# Patient Record
Sex: Female | Born: 1990
Health system: Southern US, Community
[De-identification: ages and names within clinical notes are randomized; demographics above are authoritative.]

## PROBLEM LIST (undated history)

## (undated) DIAGNOSIS — K589 Irritable bowel syndrome without diarrhea: Secondary | ICD-10-CM

## (undated) DIAGNOSIS — F419 Anxiety disorder, unspecified: Secondary | ICD-10-CM

## (undated) DIAGNOSIS — F32A Depression, unspecified: Secondary | ICD-10-CM

## (undated) DIAGNOSIS — F1910 Other psychoactive substance abuse, uncomplicated: Secondary | ICD-10-CM

## (undated) DIAGNOSIS — T7840XA Allergy, unspecified, initial encounter: Secondary | ICD-10-CM

## (undated) DIAGNOSIS — H409 Unspecified glaucoma: Secondary | ICD-10-CM

## (undated) DIAGNOSIS — Z87440 Personal history of urinary (tract) infections: Secondary | ICD-10-CM

## (undated) DIAGNOSIS — F329 Major depressive disorder, single episode, unspecified: Secondary | ICD-10-CM

## (undated) DIAGNOSIS — K219 Gastro-esophageal reflux disease without esophagitis: Secondary | ICD-10-CM

## (undated) DIAGNOSIS — R569 Unspecified convulsions: Secondary | ICD-10-CM

## (undated) HISTORY — DX: Major depressive disorder, single episode, unspecified: F32.9

## (undated) HISTORY — PX: COSMETIC SURGERY: SHX468

## (undated) HISTORY — DX: Gastro-esophageal reflux disease without esophagitis: K21.9

## (undated) HISTORY — DX: Other psychoactive substance abuse, uncomplicated: F19.10

## (undated) HISTORY — DX: Depression, unspecified: F32.A

## (undated) HISTORY — DX: Allergy, unspecified, initial encounter: T78.40XA

## (undated) HISTORY — DX: Personal history of urinary (tract) infections: Z87.440

## (undated) HISTORY — DX: Irritable bowel syndrome, unspecified: K58.9

## (undated) HISTORY — DX: Unspecified convulsions: R56.9

## (undated) HISTORY — DX: Unspecified glaucoma: H40.9

## (undated) HISTORY — DX: Anxiety disorder, unspecified: F41.9

---
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
## 2012-03-21 DIAGNOSIS — G43009 Migraine without aura, not intractable, without status migrainosus: Secondary | ICD-10-CM | POA: Insufficient documentation

## 2012-03-21 DIAGNOSIS — Z3041 Encounter for surveillance of contraceptive pills: Secondary | ICD-10-CM | POA: Insufficient documentation

## 2012-03-21 DIAGNOSIS — G43909 Migraine, unspecified, not intractable, without status migrainosus: Secondary | ICD-10-CM | POA: Insufficient documentation

## 2012-05-03 ENCOUNTER — Ambulatory Visit (INDEPENDENT_AMBULATORY_CARE_PROVIDER_SITE_OTHER): Payer: BC Managed Care – PPO | Admitting: Physician Assistant

## 2012-05-03 VITALS — BP 121/79 | HR 131 | Temp 98.0°F | Resp 16 | Ht 68.0 in | Wt 158.0 lb

## 2012-05-03 DIAGNOSIS — J029 Acute pharyngitis, unspecified: Secondary | ICD-10-CM

## 2012-05-03 DIAGNOSIS — R599 Enlarged lymph nodes, unspecified: Secondary | ICD-10-CM

## 2012-05-03 DIAGNOSIS — R590 Localized enlarged lymph nodes: Secondary | ICD-10-CM

## 2012-05-03 DIAGNOSIS — D72820 Lymphocytosis (symptomatic): Secondary | ICD-10-CM

## 2012-05-03 LAB — POCT CBC
Granulocyte percent: 28.1 %G — AB (ref 37–80)
HCT, POC: 35.9 % — AB (ref 37.7–47.9)
Hemoglobin: 11.3 g/dL — AB (ref 12.2–16.2)
Lymph, poc: 4.8 — AB (ref 0.6–3.4)
MCH, POC: 29.3 pg (ref 27–31.2)
MCHC: 31.5 g/dL — AB (ref 31.8–35.4)
MCV: 92.9 fL (ref 80–97)
MID (cbc): 1.1 — AB (ref 0–0.9)
MPV: 7.2 fL (ref 0–99.8)
POC Granulocyte: 2.3 (ref 2–6.9)
POC LYMPH PERCENT: 58.7 %L — AB (ref 10–50)
POC MID %: 13.2 %M — AB (ref 0–12)
Platelet Count, POC: 306 10*3/uL (ref 142–424)
RBC: 3.86 M/uL — AB (ref 4.04–5.48)
RDW, POC: 13.4 %
WBC: 8.1 10*3/uL (ref 4.6–10.2)

## 2012-05-03 LAB — POCT RAPID STREP A (OFFICE): Rapid Strep A Screen: NEGATIVE

## 2012-05-03 MED ORDER — FIRST-DUKES MOUTHWASH MT SUSP: 10.0000 mL | OROMUCOSAL | Status: DC | PRN

## 2012-05-03 NOTE — Progress Notes (Signed)
Subjective:    Patient ID: Monica Jackson, female    DOB: [birth year], 22 y.o.   MRN: [id number]  HPI   Monica Jackson is a very pleasant 22 yr old female here with concern for illness.  Has noted bilateral posterior cervical adenopathy for approx 3 days.  Very uncomfortable.  She is very concerned about this.  Also has had sore throat for at least a week.  Was treated with PCN for strep about a month ago, concerned that it may have returned or been incompletely treated.  Endorses some runny nose and ear fullness - "can't hear" out of right ear.  Denies fever, chills, GI symptoms, abd pain, body aches.  Very little cough, some headache. Does say she's been fatigued this week.   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  Lives at home, not in a dorm.  Thought she may have had mono as a kid but doesn't know if this was every confirmed.      Review of Systems  HENT: Positive for hearing loss (can't hear out of right ear), ear pain, sore throat and rhinorrhea.   Respiratory: Negative.   Cardiovascular: Negative.   Gastrointestinal: Negative.   Genitourinary: Negative.   Musculoskeletal: Negative.   Skin: Negative.   Neurological: Positive for headaches.       Objective:   Physical Exam  Vitals reviewed. Constitutional: She is oriented to person, place, and time. She appears well-developed and well-nourished. No distress.  HENT:  Head: Normocephalic and atraumatic.  Right Ear: Tympanic membrane and ear canal normal.  Left Ear: Tympanic membrane and ear canal normal.  Nose: Nose normal. Right sinus exhibits no maxillary sinus tenderness and no frontal sinus tenderness. Left sinus exhibits no maxillary sinus tenderness and no frontal sinus tenderness.  Mouth/Throat: Uvula is midline and mucous membranes are normal. Posterior oropharyngeal erythema present. No oropharyngeal exudate.  Neck: Neck supple.  Cardiovascular: Regular rhythm and normal heart sounds.  Tachycardia present.  Exam  reveals no gallop and no friction rub.   No murmur heard. Pulmonary/Chest: Effort normal and breath sounds normal. She has no wheezes. She has no rales.  Abdominal: Soft. Bowel sounds are normal. She exhibits no distension and no mass. There is no tenderness. There is no rebound and no guarding.  Lymphadenopathy:       Head (right side): Preauricular and posterior auricular adenopathy present. No submental, no submandibular and no occipital adenopathy present.       Head (left side): Submandibular, preauricular and posterior auricular adenopathy present. No submental and no occipital adenopathy present.    She has cervical adenopathy.       Right cervical: Posterior cervical adenopathy present.       Left cervical: Posterior cervical adenopathy present.  Neurological: She is alert and oriented to person, place, and time.  Skin: Skin is warm and dry.  Psychiatric: She has a normal mood and affect. Her behavior is normal.     Filed Vitals:   05/03/12 2038  BP: 121/79  Pulse: 131  Temp: 98 F (36.7 C)  Resp: 16      Results for orders placed in visit on 05/03/12  POCT CBC      Result Value Range   WBC 8.1  4.6 - 10.2 K/uL   Lymph, poc 4.8 (*) 0.6 - 3.4   POC LYMPH PERCENT 58.7 (*) 10 - 50 %L   MID (cbc) 1.1 (*) 0 - 0.9   POC MID % 13.2 (*)  0 - 12 %M   POC Granulocyte 2.3  2 - 6.9   Granulocyte percent 28.1 (*) 37 - 80 %G   RBC 3.86 (*) 4.04 - 5.48 M/uL   Hemoglobin 11.3 (*) 12.2 - 16.2 g/dL   HCT, POC 62.1 (*) 30.8 - 47.9 %   MCV 92.9  80 - 97 fL   MCH, POC 29.3  27 - 31.2 pg   MCHC 31.5 (*) 31.8 - 35.4 g/dL   RDW, POC 65.7     Platelet Count, POC 306  142 - 424 K/uL   MPV 7.2  0 - 99.8 fL  POCT RAPID STREP A (OFFICE)      Result Value Range   Rapid Strep A Screen Negative  Negative       Assessment & Plan:   Acute pharyngitis - Plan: POCT CBC, POCT rapid strep A, Culture, Group A Strep, Comprehensive metabolic panel, Mononucleosis screen, Epstein-Barr virus VCA  antibody panel, CMV IgM, Cytomegalovirus antibody, IgG, Diphenhyd-Hydrocort-Nystatin (FIRST-DUKES MOUTHWASH) SUSP    Posterior cervical lymphadenopathy - Plan: POCT CBC, POCT rapid strep A, Culture, Group A Strep, Comprehensive metabolic panel, Mononucleosis screen, Epstein-Barr virus VCA antibody panel, CMV IgM, Cytomegalovirus antibody, IgG  Lymphocytosis - Plan: Epstein-Barr virus VCA antibody panel, CMV IgM, Cytomegalovirus antibody, IgG    Monica Jackson is a very pleasant 22 yr old female here with pharyngitis, adenopathy, and lymphocytosis.  Clinical picture is suggestive of mono.  CMET, EBV, CMV pending.  Rapid strep is negative.  I have also sent a throat culture, though I expect this to also be negative.  Discussed with pt that her symptoms are suggestive of mono.  Discussed that there is no specific treatment, mainly symptomatic.  Will continue treating sore throat with Tylenol/Advil.  I have also sent Magic mouthwash for her to try.  Encouraged fluids and rest.  Will follow-up on labs and make adjustments to therapy if necessary.    Pt is scheduled to see PCP on Monday (5 days from now).

## 2012-05-04 LAB — COMPREHENSIVE METABOLIC PANEL
ALT: 32 U/L (ref 0–35)
AST: 38 U/L — ABNORMAL HIGH (ref 0–37)
Albumin: 4.3 g/dL (ref 3.5–5.2)
Alkaline Phosphatase: 57 U/L (ref 39–117)
BUN: 8 mg/dL (ref 6–23)
CO2: 25 mEq/L (ref 19–32)
Calcium: 9.6 mg/dL (ref 8.4–10.5)
Chloride: 105 mEq/L (ref 96–112)
Creat: 0.8 mg/dL (ref 0.50–1.10)
Glucose, Bld: 84 mg/dL (ref 70–99)
Potassium: 4.2 mEq/L (ref 3.5–5.3)
Sodium: 140 mEq/L (ref 135–145)
Total Bilirubin: 0.3 mg/dL (ref 0.3–1.2)
Total Protein: 7.6 g/dL (ref 6.0–8.3)

## 2012-05-05 LAB — MONONUCLEOSIS SCREEN: Mono Screen: POSITIVE — AB

## 2012-05-06 LAB — CULTURE, GROUP A STREP: Organism ID, Bacteria: NORMAL

## 2012-05-07 LAB — EPSTEIN-BARR VIRUS VCA ANTIBODY PANEL
EBV EA IgG: 20.8 U/mL — ABNORMAL HIGH (ref <9.0)
EBV NA IgG: <3 U/mL (ref <18.0)
EBV VCA IgG: 30.2 U/mL — ABNORMAL HIGH (ref <18.0)
EBV VCA IgM: >160 U/mL — ABNORMAL HIGH (ref <36.0)

## 2012-05-07 LAB — CYTOMEGALOVIRUS ANTIBODY, IGG: Cytomegalovirus Ab-IgG: <0.2 U/mL (ref <0.60)

## 2012-05-07 LAB — CMV IGM: CMV IgM: 80.6 AU/mL — ABNORMAL HIGH (ref <30.00)

## 2013-01-21 ENCOUNTER — Emergency Department (HOSPITAL_COMMUNITY)
Admission: EM | Admit: 2013-01-21 | Discharge: 2013-01-21 | Disposition: A | Discharge status: Home / Self Care | Payer: BC Managed Care – PPO | Attending: Emergency Medicine | Admitting: Emergency Medicine

## 2013-01-21 ENCOUNTER — Encounter (HOSPITAL_COMMUNITY): Payer: Self-pay | Admitting: Emergency Medicine

## 2013-01-21 ENCOUNTER — Emergency Department (HOSPITAL_COMMUNITY): Payer: BC Managed Care – PPO

## 2013-01-21 DIAGNOSIS — F329 Major depressive disorder, single episode, unspecified: Secondary | ICD-10-CM | POA: Insufficient documentation

## 2013-01-21 DIAGNOSIS — Y92410 Unspecified street and highway as the place of occurrence of the external cause: Secondary | ICD-10-CM | POA: Insufficient documentation

## 2013-01-21 DIAGNOSIS — Z9109 Other allergy status, other than to drugs and biological substances: Secondary | ICD-10-CM | POA: Insufficient documentation

## 2013-01-21 DIAGNOSIS — Z79899 Other long term (current) drug therapy: Secondary | ICD-10-CM | POA: Insufficient documentation

## 2013-01-21 DIAGNOSIS — M25569 Pain in unspecified knee: Secondary | ICD-10-CM | POA: Insufficient documentation

## 2013-01-21 DIAGNOSIS — Z882 Allergy status to sulfonamides status: Secondary | ICD-10-CM | POA: Insufficient documentation

## 2013-01-21 DIAGNOSIS — Y9389 Activity, other specified: Secondary | ICD-10-CM | POA: Insufficient documentation

## 2013-01-21 DIAGNOSIS — M25519 Pain in unspecified shoulder: Secondary | ICD-10-CM | POA: Insufficient documentation

## 2013-01-21 DIAGNOSIS — F3289 Other specified depressive episodes: Secondary | ICD-10-CM | POA: Insufficient documentation

## 2013-01-21 DIAGNOSIS — M7918 Myalgia, other site: Secondary | ICD-10-CM

## 2013-01-21 MED ORDER — IBUPROFEN 400 MG PO TABS
400.0000 mg | ORAL_TABLET | Freq: Once | ORAL | Status: AC
  Administered 2013-01-21: 400 mg via ORAL
  Filled 2013-01-21: qty 1

## 2013-01-21 MED ORDER — CYCLOBENZAPRINE HCL 10 MG PO TABS
5.0000 mg | ORAL_TABLET | Freq: Once | ORAL | Status: AC
  Administered 2013-01-21: 5 mg via ORAL
  Filled 2013-01-21: qty 1

## 2013-01-21 MED ORDER — CYCLOBENZAPRINE HCL 10 MG PO TABS: 10.0000 mg | ORAL_TABLET | Freq: Two times a day (BID) | ORAL | Status: DC | PRN
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
## 2013-01-21 NOTE — ED Provider Notes (Signed)
Evaluation and management procedures were performed by the PA/NP/CNM under my supervision/collaboration.   Malakie Balis J Malli Falotico, MD 01/21/13 2347 
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
## 2013-05-27 ENCOUNTER — Ambulatory Visit (INDEPENDENT_AMBULATORY_CARE_PROVIDER_SITE_OTHER): Payer: BC Managed Care – PPO | Admitting: Family Medicine

## 2013-05-27 DIAGNOSIS — J209 Acute bronchitis, unspecified: Secondary | ICD-10-CM

## 2013-05-27 DIAGNOSIS — J329 Chronic sinusitis, unspecified: Secondary | ICD-10-CM

## 2013-05-27 MED ORDER — HYDROCODONE-HOMATROPINE 5-1.5 MG/5ML PO SYRP: 5.0000 mL | ORAL_SOLUTION | Freq: Three times a day (TID) | ORAL | Status: DC | PRN

## 2013-05-27 MED ORDER — AZITHROMYCIN 250 MG PO TABS: ORAL_TABLET | ORAL | Status: DC

## 2013-05-27 NOTE — Patient Instructions (Signed)
Sinusitis  Sinusitis is redness, soreness, and swelling (inflammation) of the paranasal sinuses. Paranasal sinuses are air pockets within the bones of your face (beneath the eyes, the middle of the forehead, or above the eyes). In healthy paranasal sinuses, mucus is able to drain out, and air is able to circulate through them by way of your nose. However, when your paranasal sinuses are inflamed, mucus and air can become trapped. This can allow bacteria and other germs to grow and cause infection.  Sinusitis can develop quickly and last only a short time (acute) or continue over a long period (chronic). Sinusitis that lasts for more than 12 weeks is considered chronic.   CAUSES   Causes of sinusitis include:   Allergies.   Structural abnormalities, such as displacement of the cartilage that separates your nostrils (deviated septum), which can decrease the air flow through your nose and sinuses and affect sinus drainage.   Functional abnormalities, such as when the small hairs (cilia) that line your sinuses and help remove mucus do not work properly or are not present.  SYMPTOMS   Symptoms of acute and chronic sinusitis are the same. The primary symptoms are pain and pressure around the affected sinuses. Other symptoms include:   Upper toothache.   Earache.   Headache.   Bad breath.   Decreased sense of smell and taste.   A cough, which worsens when you are lying flat.   Fatigue.   Fever.   Thick drainage from your nose, which often is green and may contain pus (purulent).   Swelling and warmth over the affected sinuses.  DIAGNOSIS   Your caregiver will perform a physical exam. During the exam, your caregiver may:   Look in your nose for signs of abnormal growths in your nostrils (nasal polyps).   Tap over the affected sinus to check for signs of infection.   View the inside of your sinuses (endoscopy) with a special imaging device with a light attached (endoscope), which is inserted into your  sinuses.  If your caregiver suspects that you have chronic sinusitis, one or more of the following tests may be recommended:   Allergy tests.   Nasal culture A sample of mucus is taken from your nose and sent to a lab and screened for bacteria.   Nasal cytology A sample of mucus is taken from your nose and examined by your caregiver to determine if your sinusitis is related to an allergy.  TREATMENT   Most cases of acute sinusitis are related to a viral infection and will resolve on their own within 10 days. Sometimes medicines are prescribed to help relieve symptoms (pain medicine, decongestants, nasal steroid sprays, or saline sprays).   However, for sinusitis related to a bacterial infection, your caregiver will prescribe antibiotic medicines. These are medicines that will help kill the bacteria causing the infection.   Rarely, sinusitis is caused by a fungal infection. In theses cases, your caregiver will prescribe antifungal medicine.  For some cases of chronic sinusitis, surgery is needed. Generally, these are cases in which sinusitis recurs more than 3 times per year, despite other treatments.  HOME CARE INSTRUCTIONS    Drink plenty of water. Water helps thin the mucus so your sinuses can drain more easily.   Use a humidifier.   Inhale steam 3 to 4 times a day (for example, sit in the bathroom with the shower running).   Apply a warm, moist washcloth to your face 3 to 4 times a day,   or as directed by your caregiver.   Use saline nasal sprays to help moisten and clean your sinuses.   Take over-the-counter or prescription medicines for pain, discomfort, or fever only as directed by your caregiver.  SEEK IMMEDIATE MEDICAL CARE IF:   You have increasing pain or severe headaches.   You have nausea, vomiting, or drowsiness.   You have swelling around your face.   You have vision problems.   You have a stiff neck.   You have difficulty breathing.  MAKE SURE YOU:    Understand these  instructions.   Will watch your condition.   Will get help right away if you are not doing well or get worse.  Document Released: 02/28/2005 Document Revised: 05/23/2011 Document Reviewed: 03/15/2011  [employer] Patient Information 2014 [employer]          Bronchitis  Bronchitis is inflammation of the airways that extend from the windpipe into the lungs (bronchi). The inflammation often causes mucus to develop, which leads to a cough. If the inflammation becomes severe, it may cause shortness of breath.  CAUSES   Bronchitis may be caused by:    Viral infections.    Bacteria.    Cigarette smoke.    Allergens, pollutants, and other irritants.   SIGNS AND SYMPTOMS   The most common symptom of bronchitis is a frequent cough that produces mucus. Other symptoms include:   Fever.    Body aches.    Chest congestion.    Chills.    Shortness of breath.    Sore throat.   DIAGNOSIS   Bronchitis is usually diagnosed through a medical history and physical exam. Tests, such as chest X-rays, are sometimes done to rule out other conditions.   TREATMENT   You may need to avoid contact with whatever caused the problem (smoking, for example). Medicines are sometimes needed. These may include:   Antibiotics. These may be prescribed if the condition is caused by bacteria.   Cough suppressants. These may be prescribed for relief of cough symptoms.    Inhaled medicines. These may be prescribed to help open your airways and make it easier for you to breathe.    Steroid medicines. These may be prescribed for those with recurrent (chronic) bronchitis.  HOME CARE INSTRUCTIONS   Get plenty of rest.    Drink enough fluids to keep your urine clear or pale yellow (unless you have a medical condition that requires fluid restriction). Increasing fluids may help thin your secretions and will prevent dehydration.    Only take over-the-counter or prescription medicines as directed by your health care  provider.   Only take antibiotics as directed. Make sure you finish them even if you start to feel better.   Avoid secondhand smoke, irritating chemicals, and strong fumes. These will make bronchitis worse. If you are a smoker, quit smoking. Consider using nicotine gum or skin patches to help control withdrawal symptoms. Quitting smoking will help your lungs heal faster.    Put a cool-mist humidifier in your bedroom at night to moisten the air. This may help loosen mucus. Change the water in the humidifier daily. You can also run the hot water in your shower and sit in the bathroom with the door closed for 5 10 minutes.    Follow up with your health care provider as directed.    Wash your hands frequently to avoid catching bronchitis again or spreading an infection to others.   SEEK MEDICAL CARE IF:  Your symptoms   do not improve after 1 week of treatment.   SEEK IMMEDIATE MEDICAL CARE IF:   Your fever increases.   You have chills.    You have chest pain.    You have worsening shortness of breath.    You have bloody sputum.   You faint.   You have lightheadedness.   You have a severe headache.    You vomit repeatedly.  MAKE SURE YOU:    Understand these instructions.   Will watch your condition.   Will get help right away if you are not doing well or get worse.  Document Released: 02/28/2005 Document Revised: 12/19/2012 Document Reviewed: 10/23/2012  [employer] Patient Information 2014 [employer]

## 2013-05-27 NOTE — Progress Notes (Signed)
This chart was scribed for Monica SidleKurt Lauenstein, MD by [name], ED Scribe. This patient was seen in room 4 and the patient's care was started at 8:41 PM.   Patient ID: Monica MuseKarlye Loken MRN: [id number], DOB: 11-Nov-1990, 22 y.o. Date of Encounter: 05/27/2013, 8:40 PM  Primary Physician: No primary provider on file.  Chief Complaint:  Chief Complaint  Patient presents with   Sore Throat    3 days   Nasal Congestion    red, green, and yellow product   Cough   Chest Pain     HPI: 23 y.o. 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 She is also complaining of nasal congestion and headache. Pt states that her symptoms started with a sore throat 3 days ago but it has resolved. Monica Jackson just returned from [place] trip from [place]. She states that she is having to cough up green sputum. Denies any fever, chills, or diaphoresis.   She is also requesting a note for school   Past Medical History  Diagnosis Date   Depression    Allergy      Home Meds: Prior to Admission medications   Medication Sig Start Date End Date Taking? Authorizing Provider  ALPRAZolam (XANAX) 0.25 MG tablet Take 0.25 mg by mouth 4 (four) times daily as needed for anxiety (panic attack).  12/10/12  Yes Historical Provider, MD  clindamycin-tretinoin Salli Quarry(ZIANA) gel Apply 1 application topically daily.   Yes Historical Provider, MD  Levonorgestrel-Ethinyl Estradiol (SEASONIQUE) 0.15-0.03 &0.01 MG tablet Take 1 tablet by mouth at bedtime.   Yes Historical Provider, MD  traZODone (DESYREL) 50 MG tablet Take 12.5-25 mg by mouth at bedtime as needed for sleep.  12/10/12  Yes Historical Provider, MD  venlafaxine (EFFEXOR) 75 MG tablet Take 150 mg by mouth 2 (two) times daily.    Yes Historical Provider, MD  cyclobenzaprine (FLEXERIL) 10 MG tablet Take 1 tablet (10 mg total) by mouth 2 (two) times daily as needed for  muscle spasms. 01/21/13   Monica Pisciotta, PA-C    Allergies:  Allergies  Allergen Reactions   Sulfa Antibiotics Nausea And Vomiting and Other (See Comments)    Crazy feeling    History   Social History   Marital Status: Single    Spouse Name: N/A    Number of Children: N/A   Years of Education: N/A   Occupational History   Not on file.   Social History Main Topics   Smoking status: Never Smoker    Smokeless tobacco: Not on file   Alcohol Use: No   Drug Use: No   Sexual Activity: No   Other Topics Concern   Not on file   Social History Narrative   No narrative on file     Review of Systems: positive for nasal congestion, headache, and cough.  Constitutional: negative for chills, fever, night sweats, weight changes, or fatigue  HEENT: negative for vision changes, hearing loss, congestion, rhinorrhea, ST, epistaxis, or sinus pressure Cardiovascular: negative for chest pain or palpitations Respiratory: negative for hemoptysis, wheezing, shortness of breath, or cough Abdominal: negative for abdominal pain, nausea, vomiting, diarrhea, or constipation Dermatological: negative for rash Neurologic: negative for headache, dizziness, or syncope All other systems reviewed and are otherwise negative with the exception to those above and in the HPI.   Physical Exam: Expiratory wheezes. Heart sound normal. RRR. No murmur. No  rubs. No gallops.  There were no vitals taken for this visit., There is no weight on file to calculate BMI. General: Well developed, well nourished, in no acute distress. Head: Normocephalic, atraumatic, eyes without discharge, sclera non-icteric, nares are without discharge. Bilateral auditory canals clear, TM's are without perforation, pearly grey and translucent with reflective cone of light bilaterally. Oral cavity moist, posterior pharynx without exudate, erythema, peritonsillar abscess, or post nasal drip.  Neck: Supple. No thyromegaly. Full  ROM. No lymphadenopathy. Lungs: Clear bilaterally to auscultation without wheezes, rales, or rhonchi. Breathing is unlabored. Heart: RRR with S1 S2. No murmurs, rubs, or gallops appreciated. Abdomen: Soft, non-tender, non-distended with normoactive bowel sounds. No hepatomegaly. No rebound/guarding. No obvious abdominal masses. Msk:  Strength and tone normal for age. Extremities/Skin: Warm and dry. No clubbing or cyanosis. No edema. No rashes or suspicious lesions. Neuro: Alert and oriented X 3. Moves all extremities spontaneously. Gait is normal. CNII-XII grossly in tact. Psych:  Responds to questions appropriately with a normal affect.   Labs:   ASSESSMENT AND PLAN:  23 y.o. year old female with Acute bronchitis - Plan: azithromycin (ZITHROMAX Z-PAK) 250 MG tablet, HYDROcodone-homatropine (HYCODAN) 5-1.5 MG/5ML syrup  Sinusitis - Plan: azithromycin (ZITHROMAX Z-PAK) 250 MG tablet, HYDROcodone-homatropine (HYCODAN) 5-1.5 MG/5ML syrup     Signed, Monica Sidle, MD 05/27/2013 8:40 PM

## 2013-07-10 ENCOUNTER — Emergency Department (HOSPITAL_COMMUNITY): Payer: BC Managed Care – PPO

## 2013-07-10 ENCOUNTER — Emergency Department (HOSPITAL_COMMUNITY)
Admission: EM | Admit: 2013-07-10 | Discharge: 2013-07-10 | Disposition: A | Discharge status: Home / Self Care | Payer: BC Managed Care – PPO | Attending: Emergency Medicine | Admitting: Emergency Medicine

## 2013-07-10 ENCOUNTER — Encounter (HOSPITAL_COMMUNITY): Payer: Self-pay | Admitting: Emergency Medicine

## 2013-07-10 DIAGNOSIS — F3289 Other specified depressive episodes: Secondary | ICD-10-CM | POA: Insufficient documentation

## 2013-07-10 DIAGNOSIS — S53402A Unspecified sprain of left elbow, initial encounter: Secondary | ICD-10-CM

## 2013-07-10 DIAGNOSIS — Z882 Allergy status to sulfonamides status: Secondary | ICD-10-CM | POA: Insufficient documentation

## 2013-07-10 DIAGNOSIS — Y939 Activity, unspecified: Secondary | ICD-10-CM | POA: Insufficient documentation

## 2013-07-10 DIAGNOSIS — Z79899 Other long term (current) drug therapy: Secondary | ICD-10-CM | POA: Insufficient documentation

## 2013-07-10 DIAGNOSIS — IMO0002 Reserved for concepts with insufficient information to code with codable children: Secondary | ICD-10-CM | POA: Insufficient documentation

## 2013-07-10 DIAGNOSIS — Z87891 Personal history of nicotine dependence: Secondary | ICD-10-CM | POA: Insufficient documentation

## 2013-07-10 DIAGNOSIS — R51 Headache: Secondary | ICD-10-CM | POA: Insufficient documentation

## 2013-07-10 DIAGNOSIS — F329 Major depressive disorder, single episode, unspecified: Secondary | ICD-10-CM | POA: Insufficient documentation

## 2013-07-10 DIAGNOSIS — Y929 Unspecified place or not applicable: Secondary | ICD-10-CM | POA: Insufficient documentation

## 2013-07-10 DIAGNOSIS — R569 Unspecified convulsions: Secondary | ICD-10-CM | POA: Insufficient documentation

## 2013-07-10 LAB — URINALYSIS, ROUTINE W REFLEX MICROSCOPIC
Bilirubin Urine: NEGATIVE
Glucose, UA: NEGATIVE mg/dL
Ketones, ur: NEGATIVE mg/dL
Leukocytes, UA: NEGATIVE
Nitrite: NEGATIVE
Protein, ur: NEGATIVE mg/dL
Specific Gravity, Urine: 1.015 (ref 1.005–1.030)
Urobilinogen, UA: 0.2 mg/dL (ref 0.0–1.0)
pH: 6.5 (ref 5.0–8.0)

## 2013-07-10 LAB — CBC WITH DIFFERENTIAL/PLATELET
Basophils Absolute: 0 K/uL (ref 0.0–0.1)
Basophils Relative: 0 % (ref 0–1)
Eosinophils Absolute: 0 K/uL (ref 0.0–0.7)
Eosinophils Relative: 0 % (ref 0–5)
HCT: 39.4 % (ref 36.0–46.0)
Hemoglobin: 13.4 g/dL (ref 12.0–15.0)
Lymphocytes Relative: 19 % (ref 12–46)
Lymphs Abs: 1.5 K/uL (ref 0.7–4.0)
MCH: 30.9 pg (ref 26.0–34.0)
MCHC: 34 g/dL (ref 30.0–36.0)
MCV: 90.8 fL (ref 78.0–100.0)
Monocytes Absolute: 0.7 K/uL (ref 0.1–1.0)
Monocytes Relative: 8 % (ref 3–12)
Neutro Abs: 5.7 K/uL (ref 1.7–7.7)
Neutrophils Relative %: 73 % (ref 43–77)
Platelets: 272 K/uL (ref 150–400)
RBC: 4.34 MIL/uL (ref 3.87–5.11)
RDW: 12.1 % (ref 11.5–15.5)
WBC: 7.8 K/uL (ref 4.0–10.5)

## 2013-07-10 LAB — CBG MONITORING, ED: Glucose-Capillary: 72 mg/dL (ref 70–99)

## 2013-07-10 LAB — BASIC METABOLIC PANEL WITH GFR
BUN: 9 mg/dL (ref 6–23)
CO2: 24 meq/L (ref 19–32)
Calcium: 9.5 mg/dL (ref 8.4–10.5)
Chloride: 102 meq/L (ref 96–112)
Creatinine, Ser: 0.85 mg/dL (ref 0.50–1.10)
GFR calc Af Amer: >90 mL/min (ref >90)
GFR calc non Af Amer: >90 mL/min (ref >90)
Glucose, Bld: 99 mg/dL (ref 70–99)
Potassium: 3.5 meq/L — ABNORMAL LOW (ref 3.7–5.3)
Sodium: 139 meq/L (ref 137–147)

## 2013-07-10 LAB — RAPID URINE DRUG SCREEN, HOSP PERFORMED
Amphetamines: POSITIVE — AB
Barbiturates: NOT DETECTED
Benzodiazepines: NOT DETECTED
Cocaine: NOT DETECTED
Opiates: NOT DETECTED
Tetrahydrocannabinol: NOT DETECTED

## 2013-07-10 LAB — URINE MICROSCOPIC-ADD ON

## 2013-07-10 LAB — POC URINE PREG, ED: Preg Test, Ur: NEGATIVE

## 2013-07-10 MED ORDER — ACETAMINOPHEN 325 MG PO TABS
650.0000 mg | ORAL_TABLET | Freq: Once | ORAL | Status: AC
  Administered 2013-07-10: 650 mg via ORAL
  Filled 2013-07-10: qty 2

## 2013-07-10 MED ORDER — LORAZEPAM 1 MG PO TABS
1.0000 mg | ORAL_TABLET | Freq: Once | ORAL | Status: AC
  Administered 2013-07-10: 1 mg via ORAL
  Filled 2013-07-10: qty 1

## 2013-07-10 NOTE — ED Notes (Signed)
Patient complaining of head pain.  Patient did hit back of head on nightstand multiple times during seizure.

## 2013-07-10 NOTE — ED Notes (Signed)
Patient continues with headache.  Patient offered APAP and patient states she will take them.  Patient is CAOx3 at this time.

## 2013-07-10 NOTE — ED Provider Notes (Signed)
CSN: [id number]     Arrival date & time 07/10/13  0054 History   None    Chief Complaint  Patient presents with  . Seizures      Patient is a 23 y.o. female presenting with seizures. The history is provided by the patient and a parent (bystander).  Seizures Seizure activity on arrival: no   Seizure type:  Grand mal Postictal symptoms: confusion and somnolence   Return to baseline: yes   Severity:  Moderate Duration:  2 minutes Progression:  Improving Recent head injury:  No recent head injuries PTA treatment:  None History of seizures: no   pt presents for seizure She has no h/o seizure Per bystander, she heard patient fall.  She was found in the floor have generalized seizure.  This lasted 2 minutes It terminated spontaneously. She had postictal confusion She is now back to baseline She reports mild headache, bodyaches and left elbow pain No recent fever/vomiting or any other illness She reports she has not been taking her klonopin regularly No other toxic ingestions reported    Past Medical History  Diagnosis Date  . Depression   . Allergy    Past Surgical History  Procedure Laterality Date  . Cosmetic surgery     Family History  Problem Relation Age of Onset  . Diabetes Mother   . Hyperlipidemia Father   . Diabetes Maternal Grandmother   . Diabetes Maternal Grandfather   . Heart disease Maternal Grandfather   . Cancer Paternal Grandmother   . Hypertension Paternal Grandfather    History  Substance Use Topics  . Smoking status: Never Smoker   . Smokeless tobacco: Not on file  . Alcohol Use: No   OB History   Grav Para Term Preterm Abortions TAB SAB Ect Mult Living                 Review of Systems  Constitutional: Negative for fever.  Respiratory: Negative for shortness of breath.   Cardiovascular: Negative for chest pain.  Gastrointestinal: Negative for vomiting.  Neurological: Positive for seizures and headaches.  All other systems reviewed  and are negative.     Allergies  Sulfa antibiotics  Home Medications   Prior to Admission medications   Medication Sig Start Date End Date Taking? Authorizing Provider  ALPRAZolam (XANAX) 0.25 MG tablet Take 0.25 mg by mouth 4 (four) times daily as needed for anxiety (panic attack).  12/10/12   Historical Provider, MD  azithromycin (ZITHROMAX Z-PAK) 250 MG tablet Take as directed on pack 05/27/13   [name], MD  clindamycin-tretinoin Salli Quarry(ZIANA) gel Apply 1 application topically daily.    Historical Provider, MD  cyclobenzaprine (FLEXERIL) 10 MG tablet Take 1 tablet (10 mg total) by mouth 2 (two) times daily as needed for muscle spasms. 01/21/13   [name], PA-C  HYDROcodone-homatropine (HYCODAN) 5-1.5 MG/5ML syrup Take 5 mLs by mouth every 8 (eight) hours as needed for cough. 05/27/13   [name], MD  Levonorgestrel-Ethinyl Estradiol (SEASONIQUE) 0.15-0.03 &0.01 MG tablet Take 1 tablet by mouth at bedtime.    Historical Provider, MD  traZODone (DESYREL) 50 MG tablet Take 12.5-25 mg by mouth at bedtime as needed for sleep.  12/10/12   Historical Provider, MD  venlafaxine (EFFEXOR) 75 MG tablet Take 150 mg by mouth 2 (two) times daily.     Historical Provider, MD   BP 126/86  Pulse 101  Temp(Src) 97.2 F (36.2 C) (Oral)  Resp 20  SpO2 100% Physical Exam CONSTITUTIONAL:  Well developed/well nourished HEAD: Normocephalic/atraumatic EYES: EOMI/PERRL ENMT: Mucous membranes moist, small tongue laceration noted NECK: supple no meningeal signs SPINE:entire spine nontender CV: S1/S2 noted, no murmurs/rubs/gallops noted LUNGS: Lungs are clear to auscultation bilaterally, no apparent distress ABDOMEN: soft, nontender, no rebound or guarding GU:no cva tenderness NEURO: Pt is awake/alert, moves all extremitiesx4, no arm/leg drift.  No facial droop noted EXTREMITIES: pulses normal, full ROM, mild tenderness to left elbow but no deformity noted All other extremities/joints  palpated/ranged and nontender SKIN: warm, color normal PSYCH: no abnormalities of mood noted  ED Course  Procedures  Pt back to baseline No sz activity in the ED She is well appearing Advised to take klonopin as prescribed (?BDZ withdrawal sz) Also, effexor can contribute to seizures.  Advised to cut back to one tablet daily and call psychiatrist for further guidance We discussed strict return precautions We discussed seizure restrictions (no driving/swimming, no climbing trees/ladders) Given referral to neurologist Patient/mother agreeable with plan  Labs Review Labs Reviewed  BASIC METABOLIC PANEL - Abnormal; Notable for the following:    Potassium 3.5 (*)    All other components within normal limits  URINALYSIS, ROUTINE W REFLEX MICROSCOPIC - Abnormal; Notable for the following:    Hgb urine dipstick SMALL (*)    All other components within normal limits  URINE RAPID DRUG SCREEN (HOSP PERFORMED) - Abnormal; Notable for the following:    Amphetamines POSITIVE (*)    All other components within normal limits  CBC WITH DIFFERENTIAL  URINE MICROSCOPIC-ADD ON  POC URINE PREG, ED  CBG MONITORING, ED    Imaging Review Dg Elbow Complete Left  07/10/2013   CLINICAL DATA:  Fall, posterior elbow pain.  EXAM: LEFT ELBOW - COMPLETE 3+ VIEW  COMPARISON:  None.  FINDINGS: There is no evidence of fracture, dislocation, or joint effusion. There is no evidence of arthropathy or other focal bone abnormality. Soft tissue planes are normal ; intravenous catheter within the antecubital fossa.  IMPRESSION: Negative.   Electronically Signed   By: Awilda Metroourtnay  Bloomer   On: 07/10/2013 03:23   Ct Head Wo Contrast  07/10/2013   CLINICAL DATA:  First seizure.  EXAM: CT HEAD WITHOUT CONTRAST  TECHNIQUE: Contiguous axial images were obtained from the base of the skull through the vertex without intravenous contrast.  COMPARISON:  None.  FINDINGS: The ventricles and sulci are normal. No intraparenchymal  hemorrhage, mass effect nor midline shift. No acute large vascular territory infarcts.  No abnormal extra-axial fluid collections. Basal cisterns are patent.  No skull fracture. The included ocular globes and orbital contents are non-suspicious. Small left maxillary mucosal retention cysts without paranasal sinus air-fluid levels.  IMPRESSION: No acute intracranial process; normal noncontrast CT of the head.   Electronically Signed   By: Awilda Metroourtnay  Bloomer   On: 07/10/2013 03:33     Date: 07/10/2013  Rate: 112  Rhythm: sinus tachycardia  QRS Axis: normal  Intervals: normal  ST/T Wave abnormalities: nonspecific ST changes  Conduction Disutrbances:none     MDM   Final diagnoses:  Seizure  Sprain of left elbow    Nursing notes including past medical history and social history reviewed and considered in documentation Labs/vital reviewed and considered xrays reviewed and considered     Joya Gaskinsonald W Phinley Schall, MD 07/10/13 [phone]

## 2013-07-10 NOTE — ED Notes (Signed)
Patient is in CT and xray.

## 2013-07-10 NOTE — Discharge Instructions (Signed)
Please be aware you may have another seizure  Do not drive until seen by your physician for your condition  Do not climb ladders/roofs/trees as a seizure can occur at that height and cause serious harm  Do not bathe/swim alone as a seizure can occur and cause serious harm  Please followup with your physician or neurologist for further testing and possible treatment  PLEASE CONTINUE TO TAKE KLONOPIN AS DIRECTED PLEASE CUT BACK YOUR DOSE OF EFFEXOR AND CALL YOUR PSYCHIATRIST TODAY   Driving and Equipment Restrictions Some medical problems make it dangerous to drive, ride a bike, or use machines. Some of these problems are:  A hard blow to the head (concussion).  Passing out (fainting).  Twitching and shaking (seizures).  Low blood sugar.  Taking medicine to help you relax (sedatives).  Taking pain medicines.  Wearing an eye patch.  Wearing splints. This can make it hard to use parts of your body that you need to drive safely. HOME CARE   Do not drive until your doctor says it is okay.  Do not use machines until your doctor says it is okay. You may need a form signed by your doctor (medical release) before you can drive again. You may also need this form before you do other tasks where you need to be fully alert. MAKE SURE YOU:  Understand these instructions.  Will watch your condition.  Will get help right away if you are not doing well or get worse. Document Released: 04/07/2004 Document Revised: 05/23/2011 Document Reviewed: 07/08/2009 [facility] Patient Information 2014 [place] [place].

## 2013-07-10 NOTE — ED Notes (Signed)
Patient returned from ct and xray.

## 2013-07-10 NOTE — ED Notes (Signed)
Pt to ED via [facility] after reported having a grand mal seizure.  Pt does not have any hx of seizure.

## 2013-07-11 ENCOUNTER — Ambulatory Visit (INDEPENDENT_AMBULATORY_CARE_PROVIDER_SITE_OTHER): Payer: BC Managed Care – PPO | Admitting: Neurology

## 2013-07-11 ENCOUNTER — Encounter: Payer: Self-pay | Admitting: Neurology

## 2013-07-11 VITALS — BP 108/64 | HR 83 | Temp 98.4°F | Ht 68.0 in | Wt 155.8 lb

## 2013-07-11 DIAGNOSIS — F32A Depression, unspecified: Secondary | ICD-10-CM

## 2013-07-11 DIAGNOSIS — F329 Major depressive disorder, single episode, unspecified: Secondary | ICD-10-CM

## 2013-07-11 DIAGNOSIS — F411 Generalized anxiety disorder: Secondary | ICD-10-CM

## 2013-07-11 DIAGNOSIS — F3289 Other specified depressive episodes: Secondary | ICD-10-CM

## 2013-07-11 DIAGNOSIS — R569 Unspecified convulsions: Secondary | ICD-10-CM

## 2013-07-11 MED ORDER — MAGIC MOUTHWASH: 5.0000 mL | Freq: Three times a day (TID) | ORAL | Status: DC | PRN

## 2013-07-11 NOTE — Patient Instructions (Signed)
1. MRI brain with and without contrast 2. Routine EEG, then 24-hour EEG 3. Continue clonazepam 1mg  at bedtime 4. Speak to psychiatrist regarding anxiety and depression  Seizure Precautions: 1. If medication has been prescribed for you to prevent seizures, take it exactly as directed.  Do not stop taking the medicine without talking to your doctor first, even if you have not had a seizure in a long time.   2. Avoid activities in which a seizure would cause danger to yourself or to others.  Don't operate dangerous machinery, swim alone, or climb in high or dangerous places, such as on ladders, roofs, or girders.  Do not drive unless your doctor says you may.  3. If you have any warning that you may have a seizure, lay down in a safe place where you can't hurt yourself.    4.  No driving for 6 months from last seizure, [place].   Please refer to the following link on the [employer]'s website for more information: http://www.epilepsyfoundation.org/answerplace/Social/driving/drivingu.cfm   5.  Maintain good sleep hygiene.  6.  Contact your doctor if you have any problems that may be related to the medicine you are taking.  7.  Call 911 and bring the patient back to the ED if:        A.  The seizure lasts longer than 5 minutes.       B.  The patient doesn't awaken shortly after the seizure  C.  The patient has new problems such as difficulty seeing, speaking or moving  D.  The patient was injured during the seizure  E.  The patient has a temperature over 102 F (39C)  F.  The patient vomited and now is having trouble breathing
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
## 2013-07-12 ENCOUNTER — Telehealth: Payer: Self-pay | Admitting: Neurology

## 2013-07-12 NOTE — Telephone Encounter (Signed)
Pt needs a note stating that she cant fly please call [phone]

## 2013-07-12 NOTE — Telephone Encounter (Signed)
She can fly, she cannot drive or operate heavy machinery.

## 2013-07-12 NOTE — Telephone Encounter (Signed)
Is there any clinical reason she should no be flying

## 2013-07-13 DIAGNOSIS — Z87898 Personal history of other specified conditions: Secondary | ICD-10-CM | POA: Insufficient documentation

## 2013-07-13 DIAGNOSIS — F411 Generalized anxiety disorder: Secondary | ICD-10-CM | POA: Insufficient documentation

## 2013-07-13 DIAGNOSIS — F339 Major depressive disorder, recurrent, unspecified: Secondary | ICD-10-CM | POA: Insufficient documentation

## 2013-07-13 DIAGNOSIS — R569 Unspecified convulsions: Secondary | ICD-10-CM | POA: Insufficient documentation

## 2013-07-13 DIAGNOSIS — F329 Major depressive disorder, single episode, unspecified: Secondary | ICD-10-CM | POA: Insufficient documentation

## 2013-07-13 DIAGNOSIS — F32A Depression, unspecified: Secondary | ICD-10-CM | POA: Insufficient documentation

## 2013-07-15 ENCOUNTER — Ambulatory Visit (HOSPITAL_COMMUNITY)
Admission: RE | Admit: 2013-07-15 | Discharge: 2013-07-15 | Disposition: A | Discharge status: Home / Self Care | Payer: BC Managed Care – PPO | Source: Ambulatory Visit | Attending: Neurology | Admitting: Neurology

## 2013-07-15 DIAGNOSIS — R569 Unspecified convulsions: Secondary | ICD-10-CM | POA: Insufficient documentation

## 2013-07-15 MED ORDER — GADOBENATE DIMEGLUMINE 529 MG/ML IV SOLN
15.0000 mL | Freq: Once | INTRAVENOUS | Status: AC
  Administered 2013-07-15: 14 mL via INTRAVENOUS

## 2013-07-16 NOTE — Telephone Encounter (Signed)
Unable to reach patient.

## 2013-07-22 ENCOUNTER — Ambulatory Visit (INDEPENDENT_AMBULATORY_CARE_PROVIDER_SITE_OTHER): Payer: BC Managed Care – PPO | Admitting: Neurology

## 2013-07-22 DIAGNOSIS — R569 Unspecified convulsions: Secondary | ICD-10-CM

## 2013-08-08 NOTE — Procedures (Signed)
ELECTROENCEPHALOGRAM REPORT  Date of Study: 07/22/2013  Patient's Name: Monica Jackson MRN: [id number] Date of Birth: 1990/04/16  Referring Provider: Dr. Patrcia Dolly  Clinical History: This is a 23 year old woman with new onset seizure last 07/09/2013  Medications: Clonazepam, Effexor, clonidine  Technical Summary: A multichannel digital EEG recording measured by the international 10-20 system with electrodes applied with paste and impedances below 5000 ohms performed in our laboratory with EKG monitoring in an awake and asleep patient.  Hyperventilation and photic stimulation were performed.  The digital EEG was referentially recorded, reformatted, and digitally filtered in a variety of bipolar and referential montages for optimal display.  Spike detection software was employed.  Description: The patient is awake and asleep during the recording.  During maximal wakefulness, there is a symmetric, medium voltage 12 Hz posterior dominant rhythm that attenuates with eye opening.  The record is symmetric.  During drowsiness and sleep, there is an increase in theta slowing of the background and occasional vertex waves seen.  Hyperventilation and photic stimulation did not elicit any abnormalities.  There were no epileptiform discharges or electrographic seizures seen.    EKG lead was unremarkable.  Impression: This awake and asleep EEG is normal.    Clinical Correlation: A normal EEG does not exclude a clinical diagnosis of epilepsy.  If further clinical questions remain, prolonged EEG may be helpful.  Clinical correlation is advised.   Patrcia Dolly, M.D.

## 2013-08-08 NOTE — Progress Notes (Signed)
Patient came in for EEG. See Procedure notes for EEG results.  

## 2013-08-09 ENCOUNTER — Ambulatory Visit (INDEPENDENT_AMBULATORY_CARE_PROVIDER_SITE_OTHER): Payer: BC Managed Care – PPO | Admitting: Neurology

## 2013-08-09 ENCOUNTER — Encounter: Payer: Self-pay | Admitting: Neurology

## 2013-08-09 VITALS — BP 105/60 | HR 92 | Temp 98.2°F | Ht 68.0 in | Wt 154.0 lb

## 2013-08-09 DIAGNOSIS — R569 Unspecified convulsions: Secondary | ICD-10-CM

## 2013-08-09 NOTE — Patient Instructions (Signed)
1. Schedule 24-hour EEG 2. Continue all medications 3. Keep a headache diary
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
## 2013-08-16 DIAGNOSIS — N871 Moderate cervical dysplasia: Secondary | ICD-10-CM | POA: Insufficient documentation

## 2013-08-16 DIAGNOSIS — Z9889 Other specified postprocedural states: Secondary | ICD-10-CM | POA: Insufficient documentation

## 2013-08-16 NOTE — H&P (Signed)
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 Patient is not currently having periods (Reason: Oral contraceptives).    Past Medical History  Diagnosis Date  . Depression   . Allergy   . Seizures     Past Surgical History  Procedure Laterality Date  . Cosmetic surgery      Family History  Problem Relation Age of Onset  . Diabetes Mother   . Hyperlipidemia Father   . Diabetes Maternal Grandmother   . Diabetes Maternal Grandfather   . Heart disease Maternal Grandfather   . Cancer Paternal Grandmother   . Hypertension Paternal Grandfather     Social History:  reports that she has quit smoking. She does not have any smokeless tobacco history on file. She reports that she uses illicit drugs. She reports that she does not drink alcohol.  Allergies:  Allergies  Allergen Reactions  . Sulfa Antibiotics Nausea And Vomiting and Other (See Comments)    Crazy feeling    No prescriptions prior to admission    ROS  There were no vitals taken for this visit. Physical Exam  Nursing note and vitals reviewed. Constitutional: She appears well-developed.  HENT:  Head: Normocephalic.  Eyes: Pupils are equal, round, and reactive to light.  Neck: Normal range of motion.  Cardiovascular: Normal rate and regular rhythm.   Respiratory: Effort normal.  GI: Soft.    No results found for this or any previous visit (from the past 24 hour(s)).  No results found.  Assessment/Plan: CIN - 2 LEEP Cone Biopsy of Cervix  Jeani Hawking 08/16/2013, 8:41 AM

## 2013-08-27 ENCOUNTER — Encounter (HOSPITAL_COMMUNITY): Payer: BC Managed Care – PPO | Admitting: Anesthesiology

## 2013-08-27 ENCOUNTER — Ambulatory Visit (HOSPITAL_COMMUNITY): Payer: BC Managed Care – PPO | Admitting: Anesthesiology

## 2013-08-27 ENCOUNTER — Encounter (HOSPITAL_COMMUNITY)
Admission: RE | Disposition: A | Discharge status: Home / Self Care | Payer: Self-pay | Source: Ambulatory Visit | Attending: Obstetrics and Gynecology

## 2013-08-27 ENCOUNTER — Encounter (HOSPITAL_COMMUNITY): Payer: Self-pay | Admitting: Anesthesiology

## 2013-08-27 ENCOUNTER — Ambulatory Visit (HOSPITAL_COMMUNITY)
Admission: RE | Admit: 2013-08-27 | Discharge: 2013-08-27 | Disposition: A | Discharge status: Home / Self Care | Payer: BC Managed Care – PPO | Source: Ambulatory Visit | Attending: Obstetrics and Gynecology | Admitting: Obstetrics and Gynecology

## 2013-08-27 DIAGNOSIS — N871 Moderate cervical dysplasia: Secondary | ICD-10-CM | POA: Insufficient documentation

## 2013-08-27 DIAGNOSIS — Z87891 Personal history of nicotine dependence: Secondary | ICD-10-CM | POA: Insufficient documentation

## 2013-08-27 DIAGNOSIS — N879 Dysplasia of cervix uteri, unspecified: Secondary | ICD-10-CM

## 2013-08-27 HISTORY — PX: LEEP: SHX91

## 2013-08-27 LAB — PREGNANCY, URINE: Preg Test, Ur: NEGATIVE

## 2013-08-27 LAB — CBC
HCT: 38 % (ref 36.0–46.0)
Hemoglobin: 13.1 g/dL (ref 12.0–15.0)
MCH: 31.9 pg (ref 26.0–34.0)
MCHC: 34.5 g/dL (ref 30.0–36.0)
MCV: 92.5 fL (ref 78.0–100.0)
Platelets: 266 10*3/uL (ref 150–400)
RBC: 4.11 MIL/uL (ref 3.87–5.11)
RDW: 12.2 % (ref 11.5–15.5)
WBC: 6.8 10*3/uL (ref 4.0–10.5)

## 2013-08-27 SURGERY — LEEP (LOOP ELECTROSURGICAL EXCISION PROCEDURE)
Anesthesia: Monitor Anesthesia Care | Site: Vagina

## 2013-08-27 MED ORDER — KETOROLAC TROMETHAMINE 30 MG/ML IJ SOLN
INTRAMUSCULAR | Status: DC | PRN
  Administered 2013-08-27: 30 mg via INTRAVENOUS

## 2013-08-27 MED ORDER — ONDANSETRON HCL 4 MG/2ML IJ SOLN
INTRAMUSCULAR | Status: DC | PRN
  Administered 2013-08-27: 4 mg via INTRAVENOUS

## 2013-08-27 MED ORDER — FERRIC SUBSULFATE SOLN
Status: DC | PRN
  Administered 2013-08-27: 1

## 2013-08-27 MED ORDER — KETOROLAC TROMETHAMINE 30 MG/ML IJ SOLN
15.0000 mg | Freq: Once | INTRAMUSCULAR | Status: AC | PRN
  Administered 2013-08-27: 30 mg via INTRAVENOUS

## 2013-08-27 MED ORDER — MIDAZOLAM HCL 2 MG/2ML IJ SOLN
INTRAMUSCULAR | Status: DC | PRN
  Administered 2013-08-27: 2 mg via INTRAVENOUS

## 2013-08-27 MED ORDER — MIDAZOLAM HCL 2 MG/2ML IJ SOLN: 0.5000 mg | Freq: Once | INTRAMUSCULAR | Status: DC | PRN

## 2013-08-27 MED ORDER — LACTATED RINGERS IV SOLN
INTRAVENOUS | Status: DC
  Administered 2013-08-27 (×2): via INTRAVENOUS

## 2013-08-27 MED ORDER — LIDOCAINE HCL 1 % IJ SOLN
INTRAMUSCULAR | Status: DC | PRN
  Administered 2013-08-27: 10 mL

## 2013-08-27 MED ORDER — KETOROLAC TROMETHAMINE 30 MG/ML IJ SOLN
INTRAMUSCULAR | Status: AC
  Filled 2013-08-27: qty 1

## 2013-08-27 MED ORDER — LACTATED RINGERS IV SOLN
INTRAVENOUS | Status: DC
  Administered 2013-08-27: 12:00:00 via INTRAVENOUS

## 2013-08-27 MED ORDER — ONDANSETRON HCL 4 MG/2ML IJ SOLN
INTRAMUSCULAR | Status: AC
  Filled 2013-08-27: qty 2

## 2013-08-27 MED ORDER — FENTANYL CITRATE 0.05 MG/ML IJ SOLN
INTRAMUSCULAR | Status: AC
  Administered 2013-08-27: 50 ug via INTRAVENOUS
  Filled 2013-08-27: qty 2

## 2013-08-27 MED ORDER — FERRIC SUBSULFATE 259 MG/GM EX SOLN
CUTANEOUS | Status: AC
  Filled 2013-08-27: qty 8

## 2013-08-27 MED ORDER — LIDOCAINE HCL (CARDIAC) 20 MG/ML IV SOLN
INTRAVENOUS | Status: AC
  Filled 2013-08-27: qty 5

## 2013-08-27 MED ORDER — LIDOCAINE HCL (CARDIAC) 20 MG/ML IV SOLN
INTRAVENOUS | Status: DC | PRN
  Administered 2013-08-27: 50 mg via INTRAVENOUS
  Administered 2013-08-27: 30 mg via INTRAVENOUS

## 2013-08-27 MED ORDER — PROMETHAZINE HCL 25 MG/ML IJ SOLN: 6.2500 mg | INTRAMUSCULAR | Status: DC | PRN

## 2013-08-27 MED ORDER — ACETIC ACID 5 % SOLN
Status: AC
  Filled 2013-08-27: qty 500

## 2013-08-27 MED ORDER — PROPOFOL 10 MG/ML IV BOLUS
INTRAVENOUS | Status: DC | PRN
  Administered 2013-08-27: 100 mg via INTRAVENOUS

## 2013-08-27 MED ORDER — MEPERIDINE HCL 25 MG/ML IJ SOLN: 6.2500 mg | INTRAMUSCULAR | Status: DC | PRN

## 2013-08-27 MED ORDER — FENTANYL CITRATE 0.05 MG/ML IJ SOLN
INTRAMUSCULAR | Status: DC | PRN
  Administered 2013-08-27 (×2): 50 ug via INTRAVENOUS

## 2013-08-27 MED ORDER — FENTANYL CITRATE 0.05 MG/ML IJ SOLN
25.0000 ug | INTRAMUSCULAR | Status: DC | PRN
  Administered 2013-08-27 (×2): 50 ug via INTRAVENOUS

## 2013-08-27 MED ORDER — MIDAZOLAM HCL 2 MG/2ML IJ SOLN
INTRAMUSCULAR | Status: AC
  Filled 2013-08-27: qty 2

## 2013-08-27 MED ORDER — LIDOCAINE HCL 1 % IJ SOLN
INTRAMUSCULAR | Status: AC
  Filled 2013-08-27: qty 20

## 2013-08-27 MED ORDER — FENTANYL CITRATE 0.05 MG/ML IJ SOLN
INTRAMUSCULAR | Status: AC
  Filled 2013-08-27: qty 2

## 2013-08-27 MED ORDER — IODINE STRONG (LUGOLS) 5 % PO SOLN
ORAL | Status: DC | PRN
  Administered 2013-08-27: 0.1 mL

## 2013-08-27 MED ORDER — PROPOFOL 10 MG/ML IV EMUL
INTRAVENOUS | Status: AC
  Filled 2013-08-27: qty 20

## 2013-08-27 MED ORDER — IODINE STRONG (LUGOLS) 5 % PO SOLN
ORAL | Status: AC
  Filled 2013-08-27: qty 1

## 2013-08-27 SURGICAL SUPPLY — 34 items
APPLICATOR COTTON TIP 6IN STRL (MISCELLANEOUS) ×2 IMPLANT
CATH ROBINSON RED A/P 16FR (CATHETERS) ×2 IMPLANT
CLOTH BEACON ORANGE TIMEOUT ST (SAFETY) ×2 IMPLANT
CONTAINER PREFILL 10% NBF 60ML (FORM) ×4 IMPLANT
COUNTER NEEDLE 1200 MAGNETIC (NEEDLE) IMPLANT
DRSG TELFA 3X8 NADH (GAUZE/BANDAGES/DRESSINGS) ×4 IMPLANT
ELECT BALL LEEP 3MM BLK (ELECTRODE) ×1 IMPLANT
ELECT BALL LEEP 5MM RED (ELECTRODE) IMPLANT
ELECT LOOP LEEP RND 15X12 GRN (CUTTING LOOP) ×2
ELECT LOOP LEEP RND 20X12 WHT (CUTTING LOOP)
ELECT REM PT RETURN 9FT ADLT (ELECTROSURGICAL) ×2
ELECTRODE LOOP LP RND 15X12GRN (CUTTING LOOP) IMPLANT
ELECTRODE LOOP LP RND 20X12WHT (CUTTING LOOP) IMPLANT
ELECTRODE REM PT RTRN 9FT ADLT (ELECTROSURGICAL) ×1 IMPLANT
EVACUATOR PREFILTER SMOKE (MISCELLANEOUS) ×2 IMPLANT
EXTENDER ELECT LOOP LEEP 10CM (CUTTING LOOP) IMPLANT
GAUZE SPONGE 4X4 16PLY XRAY LF (GAUZE/BANDAGES/DRESSINGS) IMPLANT
GLOVE BIO SURGEON STRL SZ 6.5 (GLOVE) ×2 IMPLANT
GOWN STRL REUS W/TWL LRG LVL3 (GOWN DISPOSABLE) ×4 IMPLANT
HOSE NS SMOKE EVAC 7/8 X6 (MISCELLANEOUS) ×2 IMPLANT
NDL SPNL 22GX3.5 QUINCKE BK (NEEDLE) ×1 IMPLANT
NEEDLE SPNL 22GX3.5 QUINCKE BK (NEEDLE) ×2 IMPLANT
NS IRRIG 1000ML POUR BTL (IV SOLUTION) ×2 IMPLANT
PACK VAGINAL MINOR WOMEN LF (CUSTOM PROCEDURE TRAY) ×2 IMPLANT
PAD DRESSING TELFA 3X8 NADH (GAUZE/BANDAGES/DRESSINGS) ×2 IMPLANT
PAD OB MATERNITY 4.3X12.25 (PERSONAL CARE ITEMS) ×2 IMPLANT
PENCIL BUTTON HOLSTER BLD 10FT (ELECTRODE) ×2 IMPLANT
REDUCER FITTING SMOKE EVAC (MISCELLANEOUS) ×2 IMPLANT
SCOPETTES 8  STERILE (MISCELLANEOUS) ×2
SCOPETTES 8 STERILE (MISCELLANEOUS) ×2 IMPLANT
SYR CONTROL 10ML LL (SYRINGE) ×2 IMPLANT
TOWEL OR 17X24 6PK STRL BLUE (TOWEL DISPOSABLE) ×4 IMPLANT
TUBING SMOKE EVAC HOSE ADAPTER (MISCELLANEOUS) ×2 IMPLANT
WATER STERILE IRR 1000ML POUR (IV SOLUTION) ×2 IMPLANT

## 2013-08-27 NOTE — Brief Op Note (Signed)
08/27/2013  1:47 PM  PATIENT:  Monica Jackson  22 y.o. female  PRE-OPERATIVE DIAGNOSIS:  CIN II  POST-OPERATIVE DIAGNOSIS:  Same  PROCEDURE:  Procedure(s): LOOP ELECTROSURGICAL EXCISION PROCEDURE (LEEP) CONE BIOPSY (N/A)  SURGEON:  Surgeon(s) and Role:    * Jeani HawkingMichelle L Grewal, MD - Primary  PHYSICIAN ASSISTANT:   ASSISTANTS: none   ANESTHESIA:   paracervical block and MAC  EBL:  Total I/O In: -  Out: 100 [Urine:100]  BLOOD ADMINISTERED:none  DRAINS: none   LOCAL MEDICATIONS USED:  LIDOCAINE   SPECIMEN:  Source of Specimen:  ectocervix and endocervix  DISPOSITION OF SPECIMEN:  PATHOLOGY  COUNTS:  YES  TOURNIQUET:  * No tourniquets in log *  DICTATION: .Other Dictation: Dictation Number [id number]  PLAN OF CARE: Discharge to home after PACU  PATIENT DISPOSITION:  PACU - hemodynamically stable.   Delay start of Pharmacological VTE agent (>24hrs) due to surgical blood loss or risk of bleeding: not applicable

## 2013-08-27 NOTE — Op Note (Signed)
NAME:  HOPPERChales Jackson, [name]               ACCOUNT NO.:  [id number]  MEDICAL RECORD NO.:  [id number]  LOCATION:  [facility]                          [facility]ACILITY:  [facility]  PHYSICIAN:  Christine Morton L. Koby Pickup, M.D.DATE OF BIRTH:  Oct 22, 1990  DATE OF PROCEDURE:  08/27/2013 DATE OF DISCHARGE:  08/27/2013                              OPERATIVE REPORT   PREOPERATIVE DIAGNOSIS:  Cervical intraepithelial neoplasia II.  POSTOPERATIVE DIAGNOSIS:  Cervical intraepithelial neoplasia II.  PROCEDURE:  LEEP cone biopsy of the cervix.  SURGEON:  Jerimiah Wolman L. Vincente PoliGrewal, M.D.  ANESTHESIA:  Paracervical block.  DESCRIPTION OF PROCEDURE:  The patient was taken to the operating room. Her anesthesia was administered.  She was then prepped and draped in usual sterile fashion.  In-and-out catheter was used to empty the bladder.  The speculum was inserted to the vagina.  The cervix was grasped with a tenaculum and a paracervical block was performed.  The Lugol solution was applied.  Then using a loop, a LEEP cone biopsy was performed with first specimen was ectocervix from the anterior to posterior lip and the second specimen was obtained of the endocervical hat.  Rollerball was used for hemostasis.  Monsel's was applied.  All sponge, lap, and instrument counts were correct x2.  The patient went to recovery room in stable condition.     Ervie Mccard L. Vincente PoliGrewal, M.D.     Florestine AversMLG/MEDQ  D:  08/27/2013  T:  08/27/2013  Job:  [id number]

## 2013-08-27 NOTE — Discharge Instructions (Signed)
DISCHARGE INSTRUCTIONS: LEEP The following instructions have been prepared to help you care for yourself upon your return home.   Personal hygiene:  Use sanitary pads for vaginal drainage, not tampons.  Shower the day after your procedure.  NO tub baths, pools or Jacuzzis for 2-3 weeks.  Wipe front to back after using the bathroom.  Activity and limitations:  Do NOT drive or operate any equipment for 24 hours. The effects of anesthesia are still present and drowsiness may result.  Do NOT rest in bed all day.  Walking is encouraged.  Walk up and down stairs slowly.  You may resume your normal activity in one to two days or as indicated by your physician.  Sexual activity: NO intercourse for at least 2 weeks after the procedure, or as indicated by your physician.  Diet: Eat a light meal as desired this evening. You may resume your usual diet tomorrow.  Return to work: You may resume your work activities in one to two days or as indicated by your doctor.  What to expect after your surgery: Expect to have vaginal bleeding/discharge for 2-3 days and spotting for up to 10 days. It is not unusual to have soreness for up to 1-2 weeks. You may have a slight burning sensation when you urinate for the first day. Mild cramps may continue for a couple of days. You may have a regular period in 2-6 weeks.  Call your doctor for any of the following:  Excessive vaginal bleeding, saturating and changing one pad every hour.  Inability to urinate 6 hours after discharge from hospital.  Pain not relieved by pain medication.  Fever of 100.4 F or greater.  Unusual vaginal discharge or odor.   Call for an appointment:    Patients signature: ______________________  Nurses signature ________________________  Support person's signature_______________________

## 2013-08-27 NOTE — Anesthesia Postprocedure Evaluation (Signed)
  Anesthesia Post Note  Patient: Monica Jackson  Procedure(s) Performed: Procedure(s) (LRB): LOOP ELECTROSURGICAL EXCISION PROCEDURE (LEEP) CONE BIOPSY (N/A)  Anesthesia type: MAC  Patient location: PACU  Post pain: Pain level controlled  Post assessment: Post-op Vital signs reviewed  Last Vitals:  Filed Vitals:   08/27/13 1400  BP: 112/74  Pulse: 100  Temp: 36.6 C    Post vital signs: Reviewed  Level of consciousness: sedated  Complications: No apparent anesthesia complications

## 2013-08-27 NOTE — Progress Notes (Signed)
H and P on the chart No significant changes Will proceed with LEEP cone biopsy of cervix Consent signed

## 2013-08-27 NOTE — Transfer of Care (Signed)
Immediate Anesthesia Transfer of Care Note  Patient: Monica Jackson  Procedure(s) Performed: Procedure(s): LOOP ELECTROSURGICAL EXCISION PROCEDURE (LEEP) CONE BIOPSY (N/A)  Patient Location: PACU  Anesthesia Type:General  Level of Consciousness: awake, alert , oriented and patient cooperative  Airway & Oxygen Therapy: Patient Spontanous Breathing and Patient connected to nasal cannula oxygen  Post-op Assessment: Report given to PACU RN and Post -op Vital signs reviewed and stable  Post vital signs: Reviewed and stable  Complications: No apparent anesthesia complications

## 2013-08-27 NOTE — Anesthesia Preprocedure Evaluation (Signed)
Anesthesia Evaluation  Patient identified by MRN, date of birth, ID band Patient awake    Reviewed: Allergy & Precautions, H&P , Patient's Chart, lab work & pertinent test results, reviewed documented beta blocker date and time   Airway Mallampati: II TM Distance: >3 FB Neck ROM: full    Dental no notable dental hx.    Pulmonary former smoker,  breath sounds clear to auscultation  Pulmonary exam normal       Cardiovascular Rhythm:regular Rate:Normal     Neuro/Psych    GI/Hepatic   Endo/Other    Renal/GU      Musculoskeletal   Abdominal   Peds  Hematology   Anesthesia Other Findings   Reproductive/Obstetrics                           Anesthesia Physical Anesthesia Plan  ASA: II  Anesthesia Plan: MAC   Post-op Pain Management:    Induction: Intravenous  Airway Management Planned: LMA, Mask and Natural Airway  Additional Equipment:   Intra-op Plan:   Post-operative Plan:   Informed Consent: I have reviewed the patients History and Physical, chart, labs and discussed the procedure including the risks, benefits and alternatives for the proposed anesthesia with the patient or authorized representative who has indicated his/her understanding and acceptance.   Dental Advisory Given  Plan Discussed with: CRNA and Surgeon  Anesthesia Plan Comments: (Discussed sedation and potential to need to place airway or ETT if warranted by clinical changes intra-operatively. We will start procedure as MAC.)        Anesthesia Quick Evaluation

## 2013-08-28 ENCOUNTER — Encounter (HOSPITAL_COMMUNITY): Payer: Self-pay | Admitting: Obstetrics and Gynecology

## 2013-09-23 ENCOUNTER — Ambulatory Visit (INDEPENDENT_AMBULATORY_CARE_PROVIDER_SITE_OTHER): Payer: BC Managed Care – PPO | Admitting: Neurology

## 2013-09-23 DIAGNOSIS — R569 Unspecified convulsions: Secondary | ICD-10-CM

## 2013-09-30 NOTE — Procedures (Signed)
ELECTROENCEPHALOGRAM REPORT  Dates of Recording: 09/23/2013 to 09/24/2013  Patient's Name: Monica Jackson MRN: [id number] Date of Birth: [birth year]  Referring Provider: Dr. Patrcia DollyKaren Barrie Wale  Procedure: 24-hour ambulatory EEG  History: This is a 23 year old woman with new onset convulsion on 07/09/2013.  She also reports body jerks.  EEG for classification.  Medications: clonazepam, clonidine, Effexor  Technical Summary: This is a 24-hour multichannel digital EEG recording measured by the international 10-20 system with electrodes applied with paste and impedances below 5000 ohms performed as portable with EKG monitoring.  The digital EEG was referentially recorded, reformatted, and digitally filtered in a variety of bipolar and referential montages for optimal display.    DESCRIPTION OF RECORDING: During maximal wakefulness, the background activity consisted of a symmetric 9.5 Hz posterior dominant rhythm which was reactive to eye opening.  There were no epileptiform discharges or focal slowing seen in wakefulness.  During the recording, the patient progresses through wakefulness, drowsiness, and Stage 2 sleep.  Again, there were no epileptiform discharges seen.  Events: On 07/13 at 1430 hours, she reports a rush of anxiety.  Electrographically, there were no EEG changes seen.  EKG lead shows sinus tachycardia at 132 bpm which lasts for 2 hours.  On 07/13 at 2300 hours, she reports that it still feels like her heart is racing.  Electrographically, there were no EEG changes seen.  EKG lead shows sinus tachycardia at 126 bpm.  There were no electrographic seizures seen.    EKG lead noted to show prolonged periods of sinus tachycardia during wakefulness from 120-132 bpm.  IMPRESSION: This 24-hour ambulatory EEG study is normal.  Note of periods of sinus tachycardia lasting up to 2 hours from 120-132 bpm during wakefulness.  CLINICAL CORRELATION: A normal EEG does not exclude a clinical  diagnosis of epilepsy. Clinical correlation is advised.   Patrcia DollyKaren Kennett Symes, M.D.

## 2013-10-01 ENCOUNTER — Ambulatory Visit: Payer: Self-pay | Admitting: Neurology

## 2013-10-04 ENCOUNTER — Ambulatory Visit (INDEPENDENT_AMBULATORY_CARE_PROVIDER_SITE_OTHER): Payer: BC Managed Care – PPO | Admitting: Neurology

## 2013-10-04 ENCOUNTER — Encounter: Payer: Self-pay | Admitting: Neurology

## 2013-10-04 VITALS — BP 118/80 | HR 116 | Ht 69.0 in | Wt 156.5 lb

## 2013-10-04 DIAGNOSIS — R569 Unspecified convulsions: Secondary | ICD-10-CM

## 2013-10-04 DIAGNOSIS — F411 Generalized anxiety disorder: Secondary | ICD-10-CM

## 2013-10-04 DIAGNOSIS — G43909 Migraine, unspecified, not intractable, without status migrainosus: Secondary | ICD-10-CM

## 2013-10-04 DIAGNOSIS — R000 Tachycardia, unspecified: Secondary | ICD-10-CM
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
## 2013-10-08 ENCOUNTER — Ambulatory Visit: Payer: Self-pay | Admitting: Neurology

## 2013-11-21 ENCOUNTER — Telehealth: Payer: Self-pay | Admitting: Neurology

## 2013-11-21 NOTE — Telephone Encounter (Signed)
Pt called wanting to know the address of the heart doctor that Dr. Karel Jarvis referred her to. Please call pt [phone]. She has appt first thing in the AM.

## 2013-11-21 NOTE — Telephone Encounter (Signed)
Returned call patient give address to Dr. Judd Gaudier office.

## 2013-11-22 ENCOUNTER — Ambulatory Visit (INDEPENDENT_AMBULATORY_CARE_PROVIDER_SITE_OTHER): Payer: BC Managed Care – PPO | Admitting: Cardiology

## 2013-11-22 ENCOUNTER — Encounter: Payer: Self-pay | Admitting: Cardiology

## 2013-11-22 VITALS — BP 108/64 | HR 99 | Ht 69.0 in | Wt 157.0 lb

## 2013-11-22 DIAGNOSIS — R000 Tachycardia, unspecified: Secondary | ICD-10-CM

## 2013-11-22 DIAGNOSIS — R55 Syncope and collapse: Secondary | ICD-10-CM
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
## 2013-11-22 NOTE — Patient Instructions (Signed)
The current medical regimen is effective;  continue present plan and medications.  Your physician has requested that you have an echocardiogram. Echocardiography is a painless test that uses sound waves to create images of your heart. It provides your doctor with information about the size and shape of your heart and how well your heart's chambers and valves are working. This procedure takes approximately one hour. There are no restrictions for this procedure.  Your physician has recommended that you wear a holter monitor. Holter monitors are medical devices that record the heart's electrical activity. Doctors most often use these monitors to diagnose arrhythmias. Arrhythmias are problems with the speed or rhythm of the heartbeat. The monitor is a small, portable device. You can wear one while you do your normal daily activities. This is usually used to diagnose what is causing palpitations/syncope (passing out).  Please continue to avoid caffeine and over the counter medications containing Sudafed or stimulates.  Further follow up will based on these results.

## 2013-11-26 ENCOUNTER — Encounter (INDEPENDENT_AMBULATORY_CARE_PROVIDER_SITE_OTHER): Payer: BC Managed Care – PPO

## 2013-11-26 ENCOUNTER — Encounter: Payer: Self-pay | Admitting: *Deleted

## 2013-11-26 DIAGNOSIS — R000 Tachycardia, unspecified: Secondary | ICD-10-CM

## 2013-11-26 DIAGNOSIS — R55 Syncope and collapse: Secondary | ICD-10-CM

## 2013-11-26 NOTE — Progress Notes (Signed)
Patient ID: Monica Jackson, female   DOB: 08/06/1990, 23 y.o.   MRN: [id number] E-Cardio 24 hour holter monitor applied to patient.

## 2013-11-27 ENCOUNTER — Ambulatory Visit (HOSPITAL_COMMUNITY): Payer: BC Managed Care – PPO | Attending: Internal Medicine | Admitting: Radiology

## 2013-11-27 DIAGNOSIS — R55 Syncope and collapse: Secondary | ICD-10-CM | POA: Diagnosis present

## 2013-11-27 DIAGNOSIS — R000 Tachycardia, unspecified: Secondary | ICD-10-CM | POA: Insufficient documentation

## 2013-11-27 NOTE — Progress Notes (Signed)
Echocardiogram performed.  

## 2013-12-03 ENCOUNTER — Other Ambulatory Visit: Payer: Self-pay | Admitting: Obstetrics and Gynecology

## 2013-12-04 LAB — CYTOLOGY - PAP

## 2013-12-05 ENCOUNTER — Telehealth: Payer: Self-pay | Admitting: Cardiology

## 2013-12-05 NOTE — Telephone Encounter (Signed)
New message           Pt mother is calling for pt results of monitor and echo

## 2013-12-06 NOTE — Telephone Encounter (Signed)
Left message on pt's voicemail to call back for results. Did not leave a message on mother's voicemail. Monitor results demonstrated sinus tachycardia with a mean HR of 106 bpm, mildly elevated but no adverse arrhythmias to address. 2 D Echo was normal.

## 2013-12-06 NOTE — Telephone Encounter (Signed)
Follow Up   Mother is calling back regarding call from earlier this afternoon. Please call.

## 2013-12-06 NOTE — Telephone Encounter (Signed)
Results reviewed with mother who states understanding and did not have any further questions.  Pt will call back with further issues

## 2014-01-28 ENCOUNTER — Ambulatory Visit (INDEPENDENT_AMBULATORY_CARE_PROVIDER_SITE_OTHER): Payer: BC Managed Care – PPO | Admitting: Neurology

## 2014-01-28 ENCOUNTER — Encounter: Payer: Self-pay | Admitting: Neurology

## 2014-01-28 VITALS — BP 114/80 | HR 90 | Resp 16 | Ht 69.0 in | Wt 157.0 lb

## 2014-01-28 DIAGNOSIS — F329 Major depressive disorder, single episode, unspecified: Secondary | ICD-10-CM

## 2014-01-28 DIAGNOSIS — F32A Depression, unspecified: Secondary | ICD-10-CM

## 2014-01-28 DIAGNOSIS — R000 Tachycardia, unspecified: Secondary | ICD-10-CM

## 2014-01-28 DIAGNOSIS — R569 Unspecified convulsions: Secondary | ICD-10-CM
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
## 2014-01-28 NOTE — Progress Notes (Signed)
Done
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
## 2014-07-01 ENCOUNTER — Other Ambulatory Visit: Payer: Self-pay | Admitting: Obstetrics and Gynecology

## 2014-07-02 LAB — CYTOLOGY - PAP

## 2014-07-28 ENCOUNTER — Ambulatory Visit: Payer: BC Managed Care – PPO | Admitting: Neurology

## 2014-07-28 DIAGNOSIS — Z029 Encounter for administrative examinations, unspecified: Secondary | ICD-10-CM

## 2014-08-05 ENCOUNTER — Encounter: Payer: Self-pay | Admitting: Neurology

## 2014-09-23 ENCOUNTER — Encounter: Payer: Self-pay | Admitting: Neurology

## 2014-09-23 ENCOUNTER — Ambulatory Visit (INDEPENDENT_AMBULATORY_CARE_PROVIDER_SITE_OTHER): Payer: BLUE CROSS/BLUE SHIELD | Admitting: Neurology

## 2014-09-23 VITALS — BP 90/62 | HR 97 | Resp 16 | Ht 69.0 in | Wt 145.0 lb

## 2014-09-23 DIAGNOSIS — R569 Unspecified convulsions: Secondary | ICD-10-CM

## 2014-09-23 DIAGNOSIS — R1115 Cyclical vomiting syndrome unrelated to migraine: Secondary | ICD-10-CM | POA: Insufficient documentation

## 2014-09-23 DIAGNOSIS — G43A0 Cyclical vomiting, not intractable: Secondary | ICD-10-CM | POA: Diagnosis not present

## 2014-09-23 DIAGNOSIS — G43009 Migraine without aura, not intractable, without status migrainosus: Secondary | ICD-10-CM | POA: Diagnosis not present
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
## 2014-09-23 NOTE — Patient Instructions (Addendum)
1. Continue current dose of Topamax 2. Speak with your PCP about GI referral for vomiting 3. Follow-up in 1 year, call for any changes

## 2015-07-01 DIAGNOSIS — F339 Major depressive disorder, recurrent, unspecified: Secondary | ICD-10-CM | POA: Diagnosis not present

## 2015-08-22 DIAGNOSIS — B9689 Other specified bacterial agents as the cause of diseases classified elsewhere: Secondary | ICD-10-CM | POA: Diagnosis not present

## 2015-08-22 DIAGNOSIS — J019 Acute sinusitis, unspecified: Secondary | ICD-10-CM | POA: Diagnosis not present

## 2015-08-22 DIAGNOSIS — H65191 Other acute nonsuppurative otitis media, right ear: Secondary | ICD-10-CM | POA: Diagnosis not present

## 2015-08-22 DIAGNOSIS — J22 Unspecified acute lower respiratory infection: Secondary | ICD-10-CM | POA: Diagnosis not present

## 2015-08-25 DIAGNOSIS — Z01419 Encounter for gynecological examination (general) (routine) without abnormal findings: Secondary | ICD-10-CM | POA: Diagnosis not present

## 2015-08-25 DIAGNOSIS — Z6824 Body mass index (BMI) 24.0-24.9, adult: Secondary | ICD-10-CM | POA: Diagnosis not present

## 2015-08-25 DIAGNOSIS — Z0142 Encounter for cervical smear to confirm findings of recent normal smear following initial abnormal smear: Secondary | ICD-10-CM | POA: Diagnosis not present

## 2015-09-01 DIAGNOSIS — F339 Major depressive disorder, recurrent, unspecified: Secondary | ICD-10-CM | POA: Diagnosis not present

## 2015-10-27 DIAGNOSIS — F339 Major depressive disorder, recurrent, unspecified: Secondary | ICD-10-CM | POA: Diagnosis not present

## 2015-12-30 ENCOUNTER — Ambulatory Visit (INDEPENDENT_AMBULATORY_CARE_PROVIDER_SITE_OTHER): Payer: BLUE CROSS/BLUE SHIELD | Admitting: Podiatry

## 2015-12-30 ENCOUNTER — Encounter: Payer: Self-pay | Admitting: Podiatry

## 2015-12-30 ENCOUNTER — Ambulatory Visit (INDEPENDENT_AMBULATORY_CARE_PROVIDER_SITE_OTHER): Payer: BLUE CROSS/BLUE SHIELD

## 2015-12-30 VITALS — BP 91/56 | HR 75 | Resp 16 | Ht 69.0 in | Wt 160.0 lb

## 2015-12-30 DIAGNOSIS — M79672 Pain in left foot: Secondary | ICD-10-CM | POA: Diagnosis not present

## 2015-12-30 DIAGNOSIS — M79671 Pain in right foot: Secondary | ICD-10-CM | POA: Diagnosis not present

## 2015-12-30 DIAGNOSIS — M779 Enthesopathy, unspecified: Secondary | ICD-10-CM | POA: Diagnosis not present

## 2015-12-30 MED ORDER — TRIAMCINOLONE ACETONIDE 10 MG/ML IJ SUSP
10.0000 mg | Freq: Once | INTRAMUSCULAR | Status: AC
  Administered 2015-12-30: 10 mg

## 2015-12-30 MED ORDER — MELOXICAM 15 MG PO TABS: 15.0000 mg | ORAL_TABLET | Freq: Every day | ORAL | 2 refills | Status: DC

## 2015-12-30 NOTE — Progress Notes (Signed)
   Subjective:    Patient ID: Monica PigeonKarlye J Jackson, female    DOB: 10/07/1990, 25 y.o.   MRN: [id number]  HPI Chief Complaint  Patient presents with  . Foot Pain    Bilateral; dorsal; pt stated, "Anytime bends toes, it hurts; feels like has a nerve issue; gets a tingling sensation of pain in toes; Right foot is swollen and the right foot hurts more than the left"      Review of Systems  All other systems reviewed and are negative.      Objective:   Physical Exam        Assessment & Plan:

## 2015-12-30 NOTE — Progress Notes (Signed)
Subjective:     Patient ID: Monica Jackson, female   DOB: 03/27/1990, 25 y.o.   MRN: [id number]  HPI patient presents with mother with pain in the outside of both feet of 1-10 months duration with no specific cause. She does wear heels a lot and she has been quite active   Review of Systems  All other systems reviewed and are negative.      Objective:   Physical Exam  Constitutional: She is oriented to person, place, and time.  Cardiovascular: Intact distal pulses.   Musculoskeletal: Normal range of motion.  Neurological: She is oriented to person, place, and time.  Skin: Skin is warm.  Nursing note and vitals reviewed.  neurovascular status intact muscle strength adequate range of motion within normal limits with patient found to have quite a bit of discomfort in the dorsal lateral aspect of both feet with inflammation and fluid around the tendon complex. Patient has quite a bit of pain when I palpated to this area with no other specific issues and normal motion of the forefoot at this time. Patient's noted to have good digital perfusion and is well oriented 3     Assessment:     Dorsal tendinitis bilateral of the peroneal group and peroneal tertius group with no indication of muscle dysfunction    Plan:     H&P x-rays reviewed and careful injection of the dorsal lateral complex administered 3 mg Kenalog 5 mg Xylocaine and advised on heat ice therapy. Dispensed fascial brace is to lift up the lateral side of the foot and patient will be seen back to recheck  X-rays were negative for signs of fracture or arthritis and indicated normal arch

## 2016-01-12 DIAGNOSIS — R51 Headache: Secondary | ICD-10-CM | POA: Diagnosis not present

## 2016-01-12 DIAGNOSIS — M9902 Segmental and somatic dysfunction of thoracic region: Secondary | ICD-10-CM | POA: Diagnosis not present

## 2016-01-12 DIAGNOSIS — M9901 Segmental and somatic dysfunction of cervical region: Secondary | ICD-10-CM | POA: Diagnosis not present

## 2016-01-12 DIAGNOSIS — M9903 Segmental and somatic dysfunction of lumbar region: Secondary | ICD-10-CM | POA: Diagnosis not present

## 2016-01-12 DIAGNOSIS — F339 Major depressive disorder, recurrent, unspecified: Secondary | ICD-10-CM | POA: Diagnosis not present

## 2016-01-13 DIAGNOSIS — M9901 Segmental and somatic dysfunction of cervical region: Secondary | ICD-10-CM | POA: Diagnosis not present

## 2016-01-13 DIAGNOSIS — R51 Headache: Secondary | ICD-10-CM | POA: Diagnosis not present

## 2016-01-13 DIAGNOSIS — M9902 Segmental and somatic dysfunction of thoracic region: Secondary | ICD-10-CM | POA: Diagnosis not present

## 2016-01-13 DIAGNOSIS — M9903 Segmental and somatic dysfunction of lumbar region: Secondary | ICD-10-CM | POA: Diagnosis not present

## 2016-01-25 DIAGNOSIS — M9903 Segmental and somatic dysfunction of lumbar region: Secondary | ICD-10-CM | POA: Diagnosis not present

## 2016-01-25 DIAGNOSIS — M9901 Segmental and somatic dysfunction of cervical region: Secondary | ICD-10-CM | POA: Diagnosis not present

## 2016-01-25 DIAGNOSIS — M9902 Segmental and somatic dysfunction of thoracic region: Secondary | ICD-10-CM | POA: Diagnosis not present

## 2016-01-25 DIAGNOSIS — R51 Headache: Secondary | ICD-10-CM | POA: Diagnosis not present

## 2016-02-17 DIAGNOSIS — R21 Rash and other nonspecific skin eruption: Secondary | ICD-10-CM | POA: Diagnosis not present

## 2016-02-17 DIAGNOSIS — B349 Viral infection, unspecified: Secondary | ICD-10-CM | POA: Diagnosis not present

## 2016-02-24 DIAGNOSIS — L0291 Cutaneous abscess, unspecified: Secondary | ICD-10-CM | POA: Diagnosis not present

## 2016-02-24 DIAGNOSIS — R21 Rash and other nonspecific skin eruption: Secondary | ICD-10-CM | POA: Diagnosis not present

## 2016-02-24 DIAGNOSIS — Z113 Encounter for screening for infections with a predominantly sexual mode of transmission: Secondary | ICD-10-CM | POA: Diagnosis not present

## 2016-03-16 DIAGNOSIS — R21 Rash and other nonspecific skin eruption: Secondary | ICD-10-CM | POA: Diagnosis not present

## 2016-03-21 DIAGNOSIS — M9902 Segmental and somatic dysfunction of thoracic region: Secondary | ICD-10-CM | POA: Diagnosis not present

## 2016-03-21 DIAGNOSIS — R51 Headache: Secondary | ICD-10-CM | POA: Diagnosis not present

## 2016-03-21 DIAGNOSIS — M9901 Segmental and somatic dysfunction of cervical region: Secondary | ICD-10-CM | POA: Diagnosis not present

## 2016-03-21 DIAGNOSIS — M9903 Segmental and somatic dysfunction of lumbar region: Secondary | ICD-10-CM | POA: Diagnosis not present

## 2016-03-25 IMAGING — MR MR HEAD WO/W CM
12 of 14 series · 26 of 48 positions shown · IV contrast (multihance)
Comparison: None.

CLINICAL DATA: Seizure

EXAM:
MRI HEAD WITHOUT AND WITH CONTRAST
TECHNIQUE: Multiplanar, multiecho pulse sequences of the brain and surrounding
structures were obtained without and with intravenous contrast.
CONTRAST:  14mL MULTIHANCE GADOBENATE DIMEGLUMINE 529 MG/ML IV SOLN

[Series 2: FLAIR · sagittal · 5.0mm · 0.47mm/px · 1 of 24 slices shown (1 of 2)]
[im 1/24]
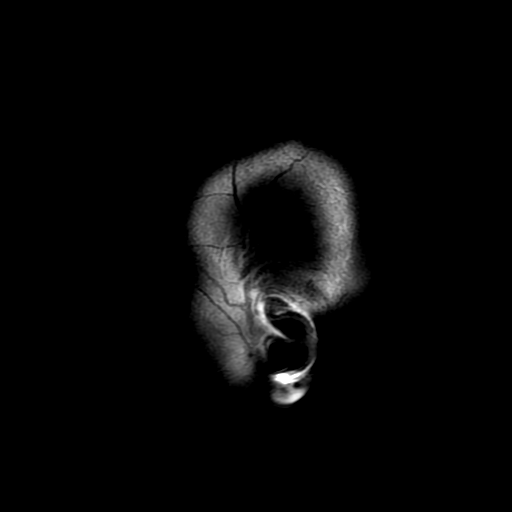

[Series 4: DWI · axial · 5.0mm · 1.02mm/px · z∈[-61,+79]mm · 3 of 58 slices shown (1 of 4)]
[im 1/58]
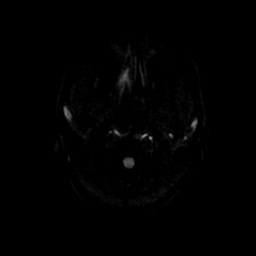
[im 29/58]
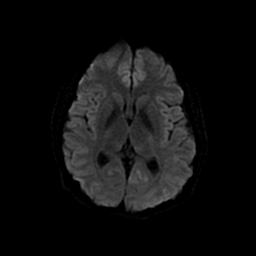
[im 58/58]
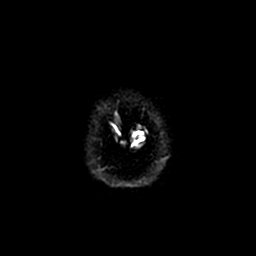

[Series 5: T2 · axial · 5.0mm · 0.43mm/px · 1 of 25 slices shown (1 of 2)]
[im 1/25]
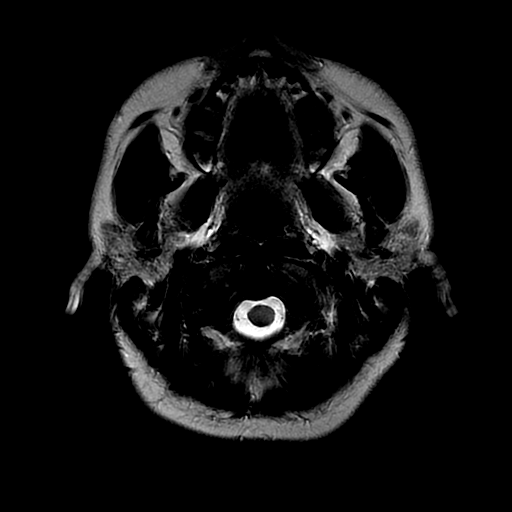

[Series 6: FLAIR · axial · 5.0mm · 0.43mm/px · 1 of 25 slices shown (2 of 2)]
[im 1/25]
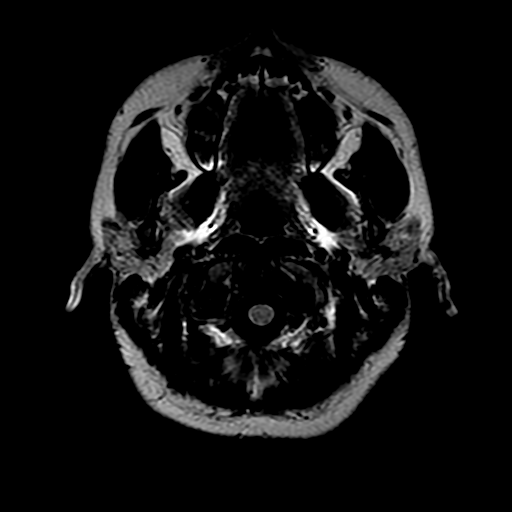

[Series 7: T2 fat-sat · coronal · 3.0mm · 0.43mm/px · 1 of 26 slices shown]
[im 1/26]
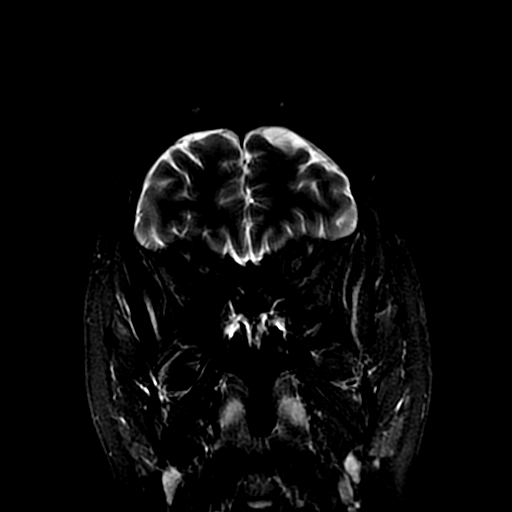

[Series 8: DWI · coronal · 5.0mm · 1.02mm/px · 3 of 66 slices shown (2 of 4)]
[im 1/66]
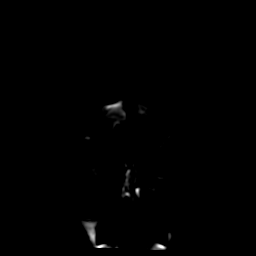
[im 33/66]
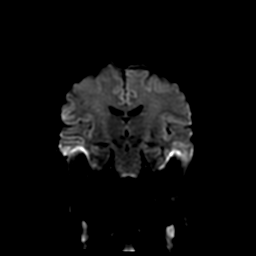
[im 66/66]
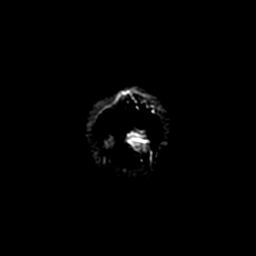

[Series 10: (person_name) · axial · 3.2mm · 0.47mm/px · z∈[-64,+75]mm · 8 of 176 slices shown]
[im 1/176]
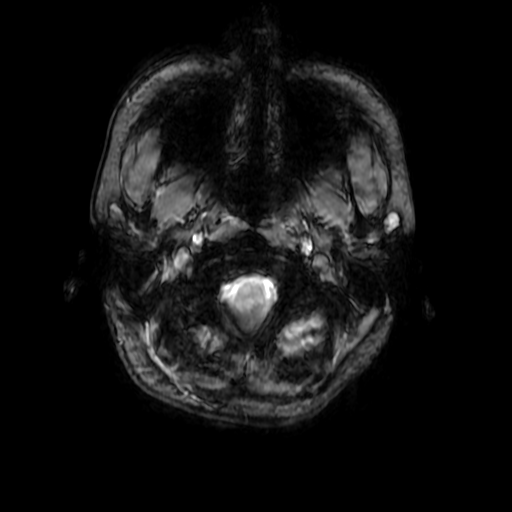
[im 22/176]
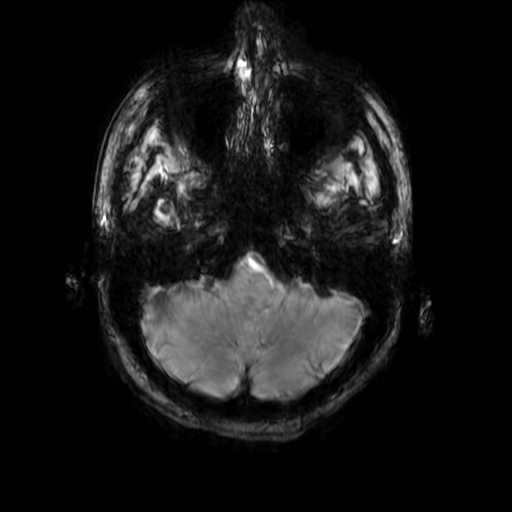
[im 44/176]
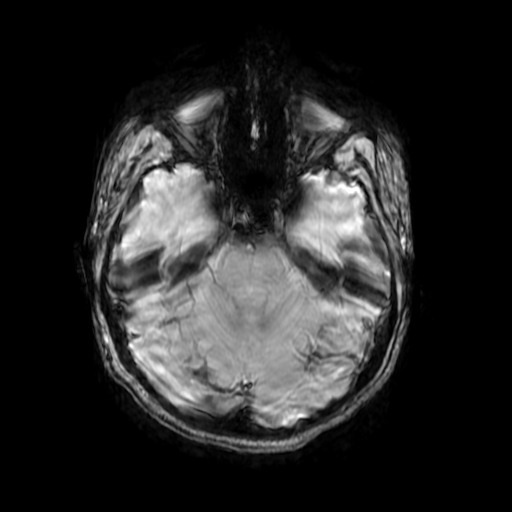
[im 66/176]
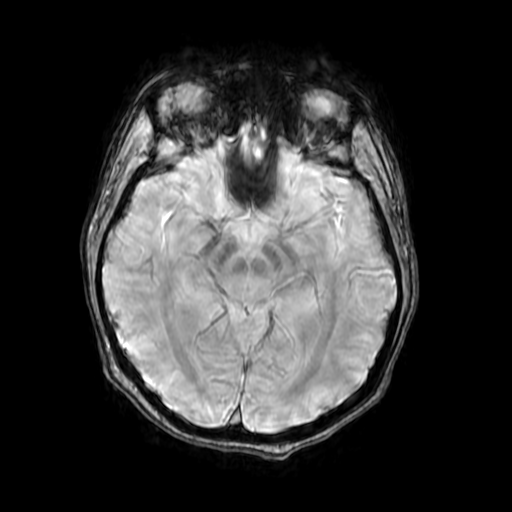
[im 110/176]
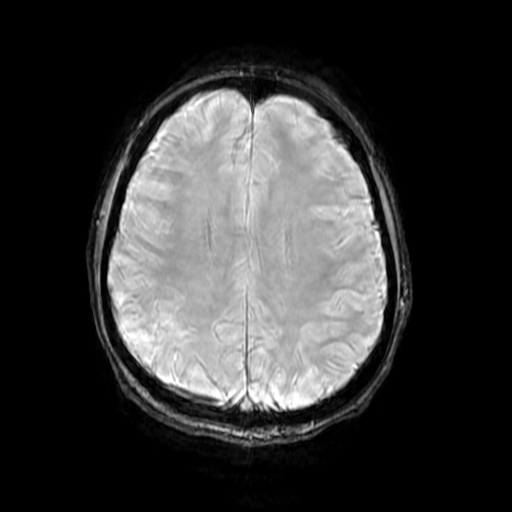
[im 132/176]
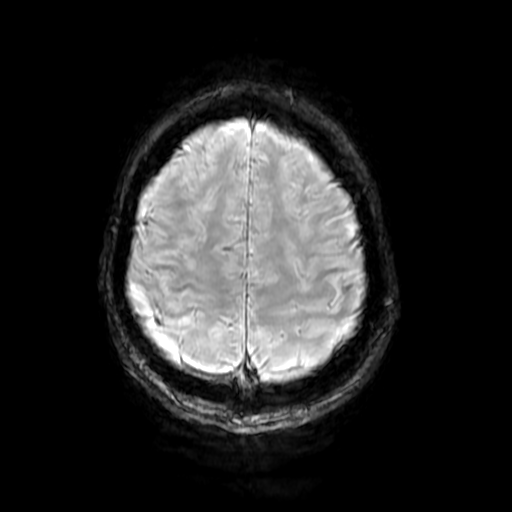
[im 154/176]
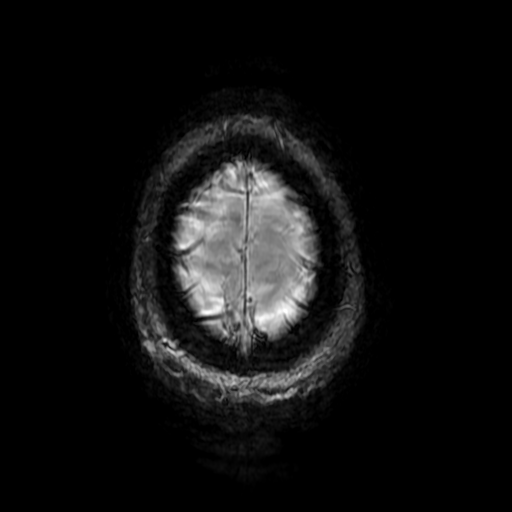
[im 176/176]
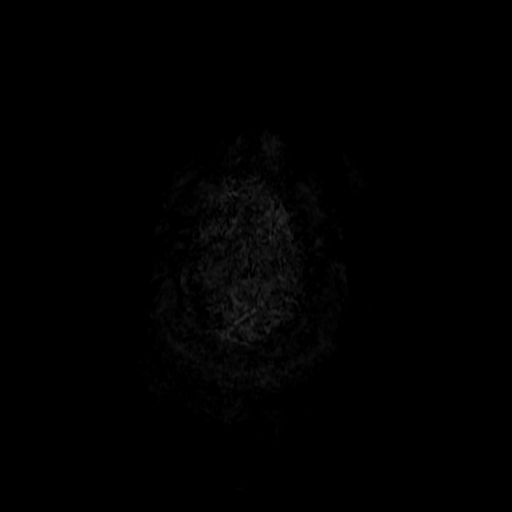

[Series 13: T2 · coronal · 5.0mm · 0.47mm/px · 2 of 33 slices shown (2 of 2)]
[im 1/33]
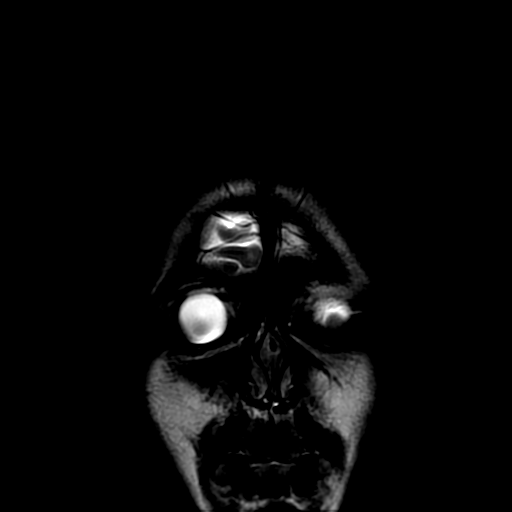
[im 33/33]
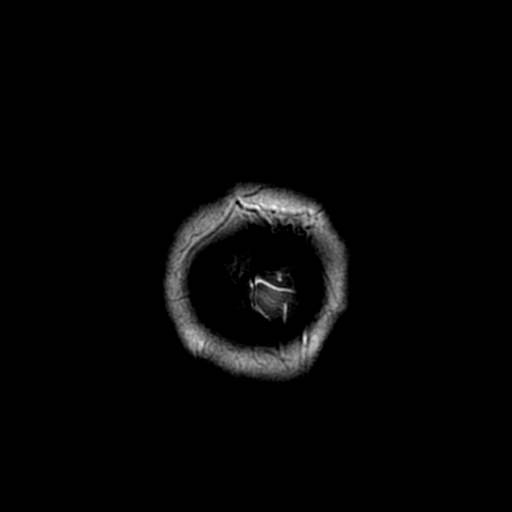

[Series 14: T1 · axial · 5.0mm · 0.43mm/px · 1 of 25 slices shown (1 of 2)]
[im 1/25]
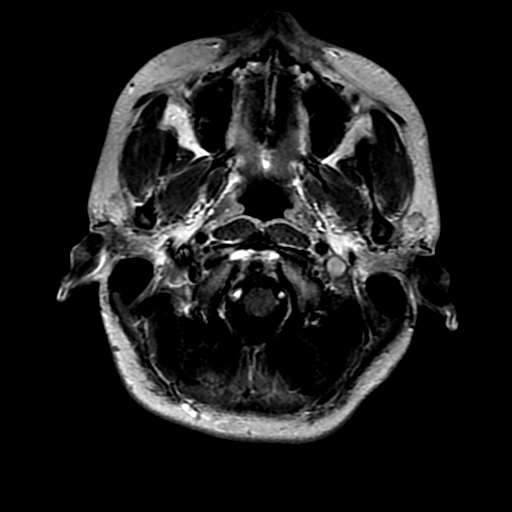

[Series 15: T1 · coronal · 5.0mm · 0.51mm/px · 2 of 33 slices shown (2 of 2)]
[im 1/33]
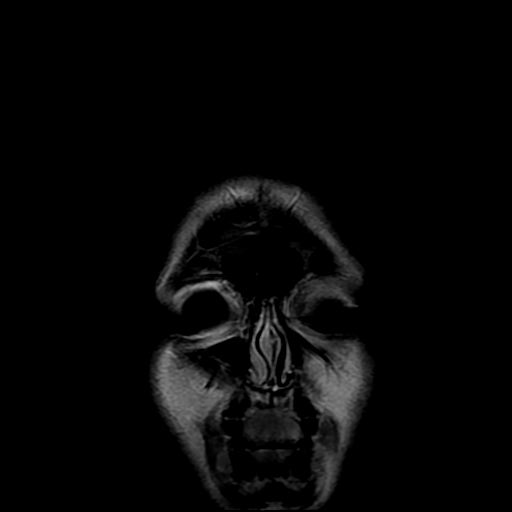
[im 33/33]
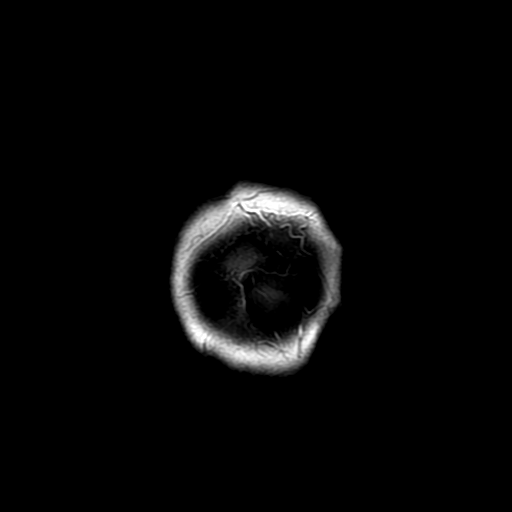

[Series 400: DWI · axial · 5.0mm · 1.02mm/px · 1 of 29 slices shown (3 of 4)]
[im 1/29]
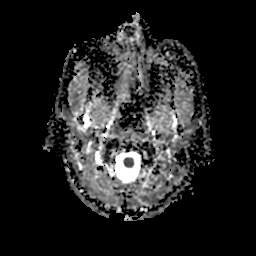

[Series 800: DWI · coronal · 5.0mm · 1.02mm/px · 2 of 33 slices shown (4 of 4)]
[im 1/33]
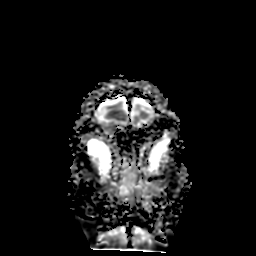
[im 33/33]
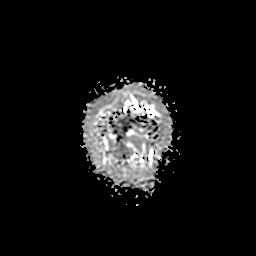

[26 of 48 positions shown; findings below may reference images not displayed]

FINDINGS: Ventricle size is normal. Craniocervical junction is normal.
Negative for Chiari malformation. Pituitary is normal in size.

Negative for acute or chronic infarction. Negative for demyelinating
disease. Cerebral white matter is normal. Brainstem and cerebellum
are normal.

Negative for hemorrhage or mass. Normal enhancement following
contrast administration.

Temporal lobe anatomy and signal are normal. Hippocampus normal
bilaterally.

Small amount of retained secretions in the left sphenoid sinus.
Remaining sinuses are clear. .
IMPRESSION: Normal MRI of the brain with contrast.

## 2016-03-28 DIAGNOSIS — R51 Headache: Secondary | ICD-10-CM | POA: Diagnosis not present

## 2016-03-28 DIAGNOSIS — M9902 Segmental and somatic dysfunction of thoracic region: Secondary | ICD-10-CM | POA: Diagnosis not present

## 2016-03-28 DIAGNOSIS — M9903 Segmental and somatic dysfunction of lumbar region: Secondary | ICD-10-CM | POA: Diagnosis not present

## 2016-03-28 DIAGNOSIS — M9901 Segmental and somatic dysfunction of cervical region: Secondary | ICD-10-CM | POA: Diagnosis not present

## 2016-04-06 DIAGNOSIS — F339 Major depressive disorder, recurrent, unspecified: Secondary | ICD-10-CM | POA: Diagnosis not present

## 2016-04-07 DIAGNOSIS — L42 Pityriasis rosea: Secondary | ICD-10-CM | POA: Diagnosis not present

## 2016-04-07 DIAGNOSIS — L700 Acne vulgaris: Secondary | ICD-10-CM | POA: Diagnosis not present

## 2016-05-16 DIAGNOSIS — R39198 Other difficulties with micturition: Secondary | ICD-10-CM | POA: Diagnosis not present

## 2016-05-16 DIAGNOSIS — N3000 Acute cystitis without hematuria: Secondary | ICD-10-CM | POA: Diagnosis not present

## 2016-05-25 DIAGNOSIS — F339 Major depressive disorder, recurrent, unspecified: Secondary | ICD-10-CM | POA: Diagnosis not present

## 2016-06-30 DIAGNOSIS — R3911 Hesitancy of micturition: Secondary | ICD-10-CM | POA: Diagnosis not present

## 2016-06-30 DIAGNOSIS — R300 Dysuria: Secondary | ICD-10-CM | POA: Diagnosis not present

## 2016-07-19 DIAGNOSIS — R3915 Urgency of urination: Secondary | ICD-10-CM | POA: Diagnosis not present

## 2016-07-19 DIAGNOSIS — R3911 Hesitancy of micturition: Secondary | ICD-10-CM | POA: Diagnosis not present

## 2016-07-20 DIAGNOSIS — F339 Major depressive disorder, recurrent, unspecified: Secondary | ICD-10-CM | POA: Diagnosis not present

## 2016-07-26 DIAGNOSIS — N311 Reflex neuropathic bladder, not elsewhere classified: Secondary | ICD-10-CM | POA: Diagnosis not present

## 2016-07-26 DIAGNOSIS — R300 Dysuria: Secondary | ICD-10-CM | POA: Diagnosis not present

## 2016-08-01 DIAGNOSIS — N398 Other specified disorders of urinary system: Secondary | ICD-10-CM | POA: Insufficient documentation

## 2016-08-30 DIAGNOSIS — M6281 Muscle weakness (generalized): Secondary | ICD-10-CM | POA: Diagnosis not present

## 2016-08-30 DIAGNOSIS — R102 Pelvic and perineal pain: Secondary | ICD-10-CM | POA: Diagnosis not present

## 2016-08-30 DIAGNOSIS — R278 Other lack of coordination: Secondary | ICD-10-CM | POA: Diagnosis not present

## 2016-08-30 DIAGNOSIS — M62838 Other muscle spasm: Secondary | ICD-10-CM | POA: Diagnosis not present

## 2016-09-05 DIAGNOSIS — M6281 Muscle weakness (generalized): Secondary | ICD-10-CM | POA: Diagnosis not present

## 2016-09-05 DIAGNOSIS — R3911 Hesitancy of micturition: Secondary | ICD-10-CM | POA: Diagnosis not present

## 2016-09-05 DIAGNOSIS — M62838 Other muscle spasm: Secondary | ICD-10-CM | POA: Diagnosis not present

## 2016-09-05 DIAGNOSIS — R3915 Urgency of urination: Secondary | ICD-10-CM | POA: Diagnosis not present

## 2016-09-12 DIAGNOSIS — M62838 Other muscle spasm: Secondary | ICD-10-CM | POA: Diagnosis not present

## 2016-09-12 DIAGNOSIS — M6281 Muscle weakness (generalized): Secondary | ICD-10-CM | POA: Diagnosis not present

## 2016-09-12 DIAGNOSIS — R3915 Urgency of urination: Secondary | ICD-10-CM | POA: Diagnosis not present

## 2016-09-12 DIAGNOSIS — R102 Pelvic and perineal pain: Secondary | ICD-10-CM | POA: Diagnosis not present

## 2016-09-13 DIAGNOSIS — F339 Major depressive disorder, recurrent, unspecified: Secondary | ICD-10-CM | POA: Diagnosis not present

## 2016-09-27 DIAGNOSIS — M6289 Other specified disorders of muscle: Secondary | ICD-10-CM | POA: Diagnosis not present

## 2016-09-27 DIAGNOSIS — N941 Unspecified dyspareunia: Secondary | ICD-10-CM | POA: Diagnosis not present

## 2016-09-27 DIAGNOSIS — M62838 Other muscle spasm: Secondary | ICD-10-CM | POA: Diagnosis not present

## 2016-09-27 DIAGNOSIS — M6281 Muscle weakness (generalized): Secondary | ICD-10-CM | POA: Diagnosis not present

## 2016-10-04 DIAGNOSIS — K5900 Constipation, unspecified: Secondary | ICD-10-CM | POA: Diagnosis not present

## 2016-10-04 DIAGNOSIS — R102 Pelvic and perineal pain: Secondary | ICD-10-CM | POA: Diagnosis not present

## 2016-10-04 DIAGNOSIS — M62838 Other muscle spasm: Secondary | ICD-10-CM | POA: Diagnosis not present

## 2016-10-04 DIAGNOSIS — M6281 Muscle weakness (generalized): Secondary | ICD-10-CM | POA: Diagnosis not present

## 2016-11-08 DIAGNOSIS — F339 Major depressive disorder, recurrent, unspecified: Secondary | ICD-10-CM | POA: Diagnosis not present

## 2016-11-09 DIAGNOSIS — M6281 Muscle weakness (generalized): Secondary | ICD-10-CM | POA: Diagnosis not present

## 2016-11-09 DIAGNOSIS — M62838 Other muscle spasm: Secondary | ICD-10-CM | POA: Diagnosis not present

## 2016-11-09 DIAGNOSIS — R3915 Urgency of urination: Secondary | ICD-10-CM | POA: Diagnosis not present

## 2016-11-09 DIAGNOSIS — R3911 Hesitancy of micturition: Secondary | ICD-10-CM | POA: Diagnosis not present

## 2016-12-12 DIAGNOSIS — Z6828 Body mass index (BMI) 28.0-28.9, adult: Secondary | ICD-10-CM | POA: Diagnosis not present

## 2016-12-12 DIAGNOSIS — Z01419 Encounter for gynecological examination (general) (routine) without abnormal findings: Secondary | ICD-10-CM | POA: Diagnosis not present

## 2016-12-13 DIAGNOSIS — R102 Pelvic and perineal pain: Secondary | ICD-10-CM | POA: Diagnosis not present

## 2016-12-13 DIAGNOSIS — M6281 Muscle weakness (generalized): Secondary | ICD-10-CM | POA: Diagnosis not present

## 2016-12-13 DIAGNOSIS — M62838 Other muscle spasm: Secondary | ICD-10-CM | POA: Diagnosis not present

## 2016-12-13 DIAGNOSIS — K5900 Constipation, unspecified: Secondary | ICD-10-CM | POA: Diagnosis not present

## 2017-01-03 DIAGNOSIS — F339 Major depressive disorder, recurrent, unspecified: Secondary | ICD-10-CM | POA: Diagnosis not present

## 2017-01-12 DIAGNOSIS — Z3202 Encounter for pregnancy test, result negative: Secondary | ICD-10-CM | POA: Diagnosis not present

## 2017-01-12 DIAGNOSIS — Z30430 Encounter for insertion of intrauterine contraceptive device: Secondary | ICD-10-CM | POA: Diagnosis not present

## 2017-02-28 DIAGNOSIS — Z30431 Encounter for routine checking of intrauterine contraceptive device: Secondary | ICD-10-CM | POA: Diagnosis not present

## 2017-02-28 DIAGNOSIS — N760 Acute vaginitis: Secondary | ICD-10-CM | POA: Diagnosis not present

## 2017-03-15 DIAGNOSIS — K625 Hemorrhage of anus and rectum: Secondary | ICD-10-CM | POA: Diagnosis not present

## 2017-03-15 DIAGNOSIS — Z6827 Body mass index (BMI) 27.0-27.9, adult: Secondary | ICD-10-CM | POA: Diagnosis not present

## 2017-03-15 DIAGNOSIS — I10 Essential (primary) hypertension: Secondary | ICD-10-CM | POA: Diagnosis not present

## 2017-03-15 DIAGNOSIS — B373 Candidiasis of vulva and vagina: Secondary | ICD-10-CM | POA: Diagnosis not present

## 2017-03-15 DIAGNOSIS — K5900 Constipation, unspecified: Secondary | ICD-10-CM | POA: Diagnosis not present

## 2017-04-05 DIAGNOSIS — H9201 Otalgia, right ear: Secondary | ICD-10-CM | POA: Diagnosis not present

## 2017-04-05 DIAGNOSIS — K5900 Constipation, unspecified: Secondary | ICD-10-CM | POA: Diagnosis not present

## 2017-04-05 DIAGNOSIS — K625 Hemorrhage of anus and rectum: Secondary | ICD-10-CM | POA: Diagnosis not present

## 2017-04-05 DIAGNOSIS — Z6828 Body mass index (BMI) 28.0-28.9, adult: Secondary | ICD-10-CM | POA: Diagnosis not present

## 2017-04-25 DIAGNOSIS — Z6827 Body mass index (BMI) 27.0-27.9, adult: Secondary | ICD-10-CM | POA: Diagnosis not present

## 2017-04-25 DIAGNOSIS — F411 Generalized anxiety disorder: Secondary | ICD-10-CM | POA: Diagnosis not present

## 2017-04-25 DIAGNOSIS — L709 Acne, unspecified: Secondary | ICD-10-CM | POA: Diagnosis not present

## 2017-04-25 DIAGNOSIS — N760 Acute vaginitis: Secondary | ICD-10-CM | POA: Diagnosis not present

## 2017-04-25 DIAGNOSIS — Z118 Encounter for screening for other infectious and parasitic diseases: Secondary | ICD-10-CM | POA: Diagnosis not present

## 2017-05-01 DIAGNOSIS — F339 Major depressive disorder, recurrent, unspecified: Secondary | ICD-10-CM | POA: Diagnosis not present

## 2017-05-24 DIAGNOSIS — M9903 Segmental and somatic dysfunction of lumbar region: Secondary | ICD-10-CM | POA: Diagnosis not present

## 2017-05-24 DIAGNOSIS — M9905 Segmental and somatic dysfunction of pelvic region: Secondary | ICD-10-CM | POA: Diagnosis not present

## 2017-05-24 DIAGNOSIS — M62830 Muscle spasm of back: Secondary | ICD-10-CM | POA: Diagnosis not present

## 2017-05-24 DIAGNOSIS — L700 Acne vulgaris: Secondary | ICD-10-CM | POA: Diagnosis not present

## 2017-05-24 DIAGNOSIS — L906 Striae atrophicae: Secondary | ICD-10-CM | POA: Diagnosis not present

## 2017-05-24 DIAGNOSIS — M9904 Segmental and somatic dysfunction of sacral region: Secondary | ICD-10-CM | POA: Diagnosis not present

## 2017-05-25 DIAGNOSIS — M9903 Segmental and somatic dysfunction of lumbar region: Secondary | ICD-10-CM | POA: Diagnosis not present

## 2017-05-25 DIAGNOSIS — M9905 Segmental and somatic dysfunction of pelvic region: Secondary | ICD-10-CM | POA: Diagnosis not present

## 2017-05-25 DIAGNOSIS — M62830 Muscle spasm of back: Secondary | ICD-10-CM | POA: Diagnosis not present

## 2017-05-25 DIAGNOSIS — M9904 Segmental and somatic dysfunction of sacral region: Secondary | ICD-10-CM | POA: Diagnosis not present

## 2017-06-05 DIAGNOSIS — M62830 Muscle spasm of back: Secondary | ICD-10-CM | POA: Diagnosis not present

## 2017-06-05 DIAGNOSIS — M9905 Segmental and somatic dysfunction of pelvic region: Secondary | ICD-10-CM | POA: Diagnosis not present

## 2017-06-05 DIAGNOSIS — M9903 Segmental and somatic dysfunction of lumbar region: Secondary | ICD-10-CM | POA: Diagnosis not present

## 2017-06-05 DIAGNOSIS — M9904 Segmental and somatic dysfunction of sacral region: Secondary | ICD-10-CM | POA: Diagnosis not present

## 2017-06-06 DIAGNOSIS — Z6828 Body mass index (BMI) 28.0-28.9, adult: Secondary | ICD-10-CM | POA: Diagnosis not present

## 2017-06-06 DIAGNOSIS — N760 Acute vaginitis: Secondary | ICD-10-CM | POA: Diagnosis not present

## 2017-06-06 DIAGNOSIS — L709 Acne, unspecified: Secondary | ICD-10-CM | POA: Diagnosis not present

## 2017-06-06 DIAGNOSIS — M9905 Segmental and somatic dysfunction of pelvic region: Secondary | ICD-10-CM | POA: Diagnosis not present

## 2017-06-06 DIAGNOSIS — K649 Unspecified hemorrhoids: Secondary | ICD-10-CM | POA: Diagnosis not present

## 2017-06-06 DIAGNOSIS — M9903 Segmental and somatic dysfunction of lumbar region: Secondary | ICD-10-CM | POA: Diagnosis not present

## 2017-06-06 DIAGNOSIS — M62830 Muscle spasm of back: Secondary | ICD-10-CM | POA: Diagnosis not present

## 2017-06-06 DIAGNOSIS — M9904 Segmental and somatic dysfunction of sacral region: Secondary | ICD-10-CM | POA: Diagnosis not present

## 2017-06-07 DIAGNOSIS — M9903 Segmental and somatic dysfunction of lumbar region: Secondary | ICD-10-CM | POA: Diagnosis not present

## 2017-06-07 DIAGNOSIS — M62830 Muscle spasm of back: Secondary | ICD-10-CM | POA: Diagnosis not present

## 2017-06-07 DIAGNOSIS — M9905 Segmental and somatic dysfunction of pelvic region: Secondary | ICD-10-CM | POA: Diagnosis not present

## 2017-06-07 DIAGNOSIS — M9904 Segmental and somatic dysfunction of sacral region: Secondary | ICD-10-CM | POA: Diagnosis not present

## 2017-07-18 DIAGNOSIS — F339 Major depressive disorder, recurrent, unspecified: Secondary | ICD-10-CM | POA: Diagnosis not present

## 2017-08-29 DIAGNOSIS — Z0000 Encounter for general adult medical examination without abnormal findings: Secondary | ICD-10-CM | POA: Diagnosis not present

## 2017-08-29 DIAGNOSIS — Z114 Encounter for screening for human immunodeficiency virus [HIV]: Secondary | ICD-10-CM | POA: Diagnosis not present

## 2017-08-29 DIAGNOSIS — Z1329 Encounter for screening for other suspected endocrine disorder: Secondary | ICD-10-CM | POA: Diagnosis not present

## 2017-08-29 DIAGNOSIS — Z13220 Encounter for screening for lipoid disorders: Secondary | ICD-10-CM | POA: Diagnosis not present

## 2017-09-12 DIAGNOSIS — Z0000 Encounter for general adult medical examination without abnormal findings: Secondary | ICD-10-CM | POA: Diagnosis not present

## 2017-09-12 DIAGNOSIS — Z23 Encounter for immunization: Secondary | ICD-10-CM | POA: Diagnosis not present

## 2017-09-12 DIAGNOSIS — Z6828 Body mass index (BMI) 28.0-28.9, adult: Secondary | ICD-10-CM | POA: Diagnosis not present

## 2018-01-10 ENCOUNTER — Encounter: Payer: Self-pay | Admitting: Psychiatry

## 2018-01-10 ENCOUNTER — Ambulatory Visit (INDEPENDENT_AMBULATORY_CARE_PROVIDER_SITE_OTHER): Payer: BLUE CROSS/BLUE SHIELD | Admitting: Psychiatry

## 2018-01-10 DIAGNOSIS — F331 Major depressive disorder, recurrent, moderate: Secondary | ICD-10-CM | POA: Diagnosis not present

## 2018-01-10 DIAGNOSIS — F4310 Post-traumatic stress disorder, unspecified: Secondary | ICD-10-CM | POA: Diagnosis not present

## 2018-01-10 DIAGNOSIS — F1121 Opioid dependence, in remission: Secondary | ICD-10-CM | POA: Diagnosis not present

## 2018-01-10 DIAGNOSIS — F4001 Agoraphobia with panic disorder: Secondary | ICD-10-CM | POA: Diagnosis not present

## 2018-01-10 MED ORDER — DULOXETINE HCL 20 MG PO CPEP: 40.0000 mg | ORAL_CAPSULE | Freq: Every day | ORAL | 1 refills | Status: DC

## 2018-01-10 NOTE — Progress Notes (Signed)
Monica Jackson [id number] 09-20-1990 27 y.o.  Subjective:   Patient ID:  Monica Jackson is a 27 y.o. (DOB Sep 06, 1990) female.  Chief Complaint:  Chief Complaint  Patient presents with  . Anxiety  . Depression  . Sleeping Problem    HPI Monica Jackson presents to the office today for follow-up of PTSD.  Stressed Advertising copywriter and doing Merchandiser, retail.  Wants to increase duloxetine to help energy and motivation.  Been very busy  Real pleased with her success.  Patient reports stable mood and denies depressed or irritable moods.  Patient denies any recent difficulty with anxiety.  Patient denies difficulty with sleep initiation or maintenance. Denies appetite disturbance.  Patient reports that energy and motivation have been good.  Patient denies any difficulty with concentration.  Patient denies any suicidal ideation. Breakup has helped her anxiety.  Wants to sleep too much .  Hopes increase in duloxetine will help. Maintaining sobriety.  Review of Systems:  Review of Systems  Neurological: Negative for tremors and weakness.  Psychiatric/Behavioral: Negative for agitation, behavioral problems, confusion, decreased concentration, dysphoric mood, hallucinations, self-injury, sleep disturbance and suicidal ideas. The patient is not nervous/anxious and is not hyperactive.     Medications: I have reviewed the patient's current medications.  Current Outpatient Medications  Medication Sig Dispense Refill  . levonorgestrel (MIRENA, 52 MG,) 20 MCG/24HR IUD 1 each by Intrauterine route once.    . propranolol (INDERAL) 20 MG tablet TAKE 2 TABLETS 3 TIMES DAILY AS NEEDED FOR ANXIETY  2  . SUMAtriptan (IMITREX) 50 MG tablet Take 1 tablet at onset of headache, may repeat in 2 hours    . topiramate (TOPAMAX) 100 MG tablet Take 100 mg by mouth 2 (two) times daily. Take 1-1/2 tabs at bedtime    . doxycycline (VIBRA-TABS) 100 MG tablet Take 100 mg by mouth daily. Take 1 tablet twice daily for skin   2  . DULoxetine (CYMBALTA) 20 MG capsule Take 2 capsules (40 mg total) by mouth daily. 180 capsule 1  . meloxicam (MOBIC) 15 MG tablet Take 1 tablet (15 mg total) by mouth daily. (Patient not taking: Reported on 01/10/2018) 30 tablet 2  . norethindrone (MICRONOR,CAMILA,ERRIN) 0.35 MG tablet Take 1 tablet by mouth daily.    Marland Kitchen omeprazole (PRILOSEC) 40 MG capsule Take 40 mg by mouth as needed.     . traZODone (DESYREL) 50 MG tablet Take 50-100 mg by mouth at bedtime as needed.   1   No current facility-administered medications for this visit.     Medication Side Effects: None  Allergies:  Allergies  Allergen Reactions  . Sulfa Antibiotics Nausea And Vomiting and Other (See Comments)    Crazy feeling    Past Medical History:  Diagnosis Date  . Allergy   . Depression   . Seizures (HCC)     Family History  Problem Relation Age of Onset  . Diabetes Mother   . Hyperlipidemia Father   . Diabetes Maternal Grandmother   . Diabetes Maternal Grandfather   . Heart disease Maternal Grandfather   . Cancer Paternal Grandmother   . Hypertension Paternal Grandfather     Social History   Socioeconomic History  . Marital status: Single    Spouse name: Not on file  . Number of children: Not on file  . Years of education: Not on file  . Highest education level: Not on file  Occupational History  . Not on file  Social Needs  . Financial resource strain:  Not on file  . Food insecurity:    Worry: Not on file    Inability: Not on file  . Transportation needs:    Medical: Not on file    Non-medical: Not on file  Tobacco Use  . Smoking status: Former Games developer  . Smokeless tobacco: Never Used  Substance and Sexual Activity  . Alcohol use: No    Alcohol/week: 0.0 standard drinks    Comment: Has not drank in several months  . Drug use: Yes    Comment: Clean for several months  . Sexual activity: Never    Birth control/protection: None  Lifestyle  . Physical activity:    Days per  week: Not on file    Minutes per session: Not on file  . Stress: Not on file  Relationships  . Social connections:    Talks on phone: Not on file    Gets together: Not on file    Attends religious service: Not on file    Active member of club or organization: Not on file    Attends meetings of clubs or organizations: Not on file    Relationship status: Not on file  . Intimate partner violence:    Fear of current or ex partner: Not on file    Emotionally abused: Not on file    Physically abused: Not on file    Forced sexual activity: Not on file  Other Topics Concern  . Not on file  Social History Narrative  . Not on file   [place]  Doing a lot of construction work.  Past Medical History, Surgical history, Social history, and Family history were reviewed and updated as appropriate.   Please see review of systems for further details on the patient's review from today.   Objective:   Physical Exam:  There were no vitals taken for this visit.  Physical Exam  Constitutional: She is oriented to person, place, and time. She appears well-developed. No distress.  Musculoskeletal: She exhibits no deformity.  Neurological: She is alert and oriented to person, place, and time. She displays no atrophy and no tremor. Coordination and gait normal.  Psychiatric: She has a normal mood and affect. Her speech is normal and behavior is normal. Judgment and thought content normal. Her mood appears not anxious. Her affect is not angry, not blunt, not labile and not inappropriate. Cognition and memory are normal. She does not exhibit a depressed mood. She expresses no homicidal and no suicidal ideation. She expresses no suicidal plans and no homicidal plans.  Insight intact. No auditory or visual hallucinations. No delusions.  She is attentive.    Lab Review:     Component Value Date/Time   NA 139 07/10/2013 0152   K 3.5 (L) 07/10/2013 0152   CL 102 07/10/2013  0152   CO2 24 07/10/2013 0152   GLUCOSE 99 07/10/2013 0152   BUN 9 07/10/2013 0152   CREATININE 0.85 07/10/2013 0152   CREATININE 0.80 05/03/2012 2206   CALCIUM 9.5 07/10/2013 0152   PROT 7.6 05/03/2012 2206   ALBUMIN 4.3 05/03/2012 2206   AST 38 (H) 05/03/2012 2206   ALT 32 05/03/2012 2206   ALKPHOS 57 05/03/2012 2206   BILITOT 0.3 05/03/2012 2206   GFRNONAA >90 07/10/2013 0152   GFRAA >90 07/10/2013 0152       Component Value Date/Time   WBC 6.8 08/27/2013 1155   RBC 4.11 08/27/2013 1155   HGB 13.1 08/27/2013 1155  HCT 38.0 08/27/2013 1155   PLT 266 08/27/2013 1155   MCV 92.5 08/27/2013 1155   MCV 92.9 05/03/2012 2209   MCH 31.9 08/27/2013 1155   MCHC 34.5 08/27/2013 1155   RDW 12.2 08/27/2013 1155   LYMPHSABS 1.5 07/10/2013 0152   MONOABS 0.7 07/10/2013 0152   EOSABS 0.0 07/10/2013 0152   BASOSABS 0.0 07/10/2013 0152    No results found for: POCLITH, LITHIUM   No results found for: PHENYTOIN, PHENOBARB, VALPROATE, CBMZ   .res Assessment: Plan:    PTSD (post-traumatic stress disorder)  Panic disorder with agoraphobia  Major depressive disorder, recurrent episode, moderate (HCC)  Opioid dependence in remission (HCC)  Greater than 50% of face to face time with patient was spent on counseling and coordination of care. We discussed her multiple diagnoses and medications and options for treatment.  Discussed the risks of the noradrenergic effect of duloxetine to cause some increase in anxiety if we go up to high too fast.  We also discussed her history of medication sensitivity and how many people with that sensitivity gradually gravitate towards a mean.  Overall her anxiety seems to be fairly well controlled at this time. OK increase duloxetine to 40 mg from 30 mg.  Maintain that for at least 30 days and if she feels she needs to go higher she can call and we can gradually increase the dose to the usual target of 60 mg if needed.  Supportive therapy on self-care  and maintaining sobriety.  She is committed.  15-minute appointment today  Follow-up 3 months. Meredith Staggers MD, DFAPA  Please see After Visit Summary for patient specific instructions.  No future appointments.  No orders of the defined types were placed in this encounter.     -------------------------------

## 2018-02-12 ENCOUNTER — Other Ambulatory Visit: Payer: Self-pay

## 2018-02-12 MED ORDER — TOPIRAMATE 100 MG PO TABS: 100.0000 mg | ORAL_TABLET | Freq: Two times a day (BID) | ORAL | 0 refills | Status: DC

## 2018-02-12 MED ORDER — TOPIRAMATE 100 MG PO TABS: ORAL_TABLET | ORAL | 0 refills | Status: DC

## 2018-03-08 DIAGNOSIS — L700 Acne vulgaris: Secondary | ICD-10-CM | POA: Diagnosis not present

## 2018-03-09 ENCOUNTER — Encounter: Payer: Self-pay | Admitting: Psychiatry

## 2018-03-09 ENCOUNTER — Ambulatory Visit (INDEPENDENT_AMBULATORY_CARE_PROVIDER_SITE_OTHER): Payer: BLUE CROSS/BLUE SHIELD | Admitting: Psychiatry

## 2018-03-09 VITALS — BP 127/81 | HR 113

## 2018-03-09 DIAGNOSIS — F4001 Agoraphobia with panic disorder: Secondary | ICD-10-CM | POA: Diagnosis not present

## 2018-03-09 DIAGNOSIS — F4310 Post-traumatic stress disorder, unspecified: Secondary | ICD-10-CM | POA: Diagnosis not present

## 2018-03-09 DIAGNOSIS — F1121 Opioid dependence, in remission: Secondary | ICD-10-CM | POA: Diagnosis not present

## 2018-03-09 DIAGNOSIS — F331 Major depressive disorder, recurrent, moderate: Secondary | ICD-10-CM | POA: Diagnosis not present

## 2018-03-09 NOTE — Progress Notes (Signed)
Monica PigeonKarlye J Wolgamott [id number] Apr 07, 1990 27 y.o.  Subjective:   Patient ID:  Monica Jackson is a 27 y.o. (DOB Apr 07, 1990) female.  Chief Complaint:  Chief Complaint  Patient presents with  . Follow-up    Medication Management    HPI Monica Jackson presents to the office today for follow-up of PTSD.    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  Gets bored easily.  I need to do something with my life.  Wonders about counseling again for  Focus.  When on the road sleep a lot.  Night owl.  Doesn't feel as depressed as in the past. Still irritable.  Finds her self struggling in relationships.  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   Been very busy  Real pleased with her success.  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  Patient reports some depressed or irritable moods.  Patient denies any recent difficulty with anxiety.  Patient denies difficulty with sleep initiation or maintenance. Denies appetite disturbance.  Patient reports that energy and motivation have been good.  Patient denies any difficulty with concentration.  Patient denies any suicidal ideation. Breakup has helped her anxiety.  Wants to sleep too much .  Hopes increase in duloxetine will help. Maintaining sobriety.  Review of Systems:  Review of Systems  Neurological: Negative for tremors and weakness.  Psychiatric/Behavioral: Positive for dysphoric mood. Negative for agitation, behavioral problems, confusion, decreased concentration, hallucinations, self-injury, sleep disturbance and suicidal ideas. The patient is not nervous/anxious and is not hyperactive.        Irritable.    Medications: I have reviewed the patient's current medications.  Current Outpatient Medications  Medication Sig Dispense Refill  . DULoxetine (CYMBALTA) 20 MG capsule Take 2 capsules (40 mg total) by mouth daily. (Patient taking differently: Take 60 mg by mouth  daily. ) 180 capsule 1  . levonorgestrel (MIRENA, 52 MG,) 20 MCG/24HR IUD 1 each by Intrauterine route once.    . propranolol (INDERAL) 20 MG tablet TAKE 2 TABLETS 3 TIMES DAILY AS NEEDED FOR ANXIETY  2  . SUMAtriptan (IMITREX) 50 MG tablet Take 1 tablet at onset of headache, may repeat in 2 hours    . topiramate (TOPAMAX) 100 MG tablet Take 1 tablet twice daily. 180 tablet 0  . traZODone (DESYREL) 50 MG tablet Take 50-100 mg by mouth at bedtime as needed.   1  . doxycycline (VIBRA-TABS) 100 MG tablet Take 100 mg by mouth daily. Take 1 tablet twice daily for skin  2  . meloxicam (MOBIC) 15 MG tablet Take 1 tablet (15 mg total) by mouth daily. (Patient not taking: Reported on 01/10/2018) 30 tablet 2  . norethindrone (MICRONOR,CAMILA,ERRIN) 0.35 MG tablet Take 1 tablet by mouth daily.    Marland Kitchen. omeprazole (PRILOSEC) 40 MG capsule Take 40 mg by mouth as needed.      No current facility-administered medications for this visit.     Medication Side Effects: None  Allergies:  Allergies  Allergen Reactions  . Sulfa Antibiotics Nausea And Vomiting and Other (See Comments)    Crazy feeling    Past Medical History:  Diagnosis Date  . Allergy   . Depression   . Seizures (HCC)     Family History  Problem Relation Age of Onset  . Diabetes Mother   . Hyperlipidemia Father   . Diabetes Maternal Grandmother   . Diabetes Maternal Grandfather   .  Heart disease Maternal Grandfather   . Cancer Paternal Grandmother   . Hypertension Paternal Grandfather     Social History   Socioeconomic History  . Marital status: Single    Spouse name: Not on file  . Number of children: Not on file  . Years of education: Not on file  . Highest education level: Not on file  Occupational History  . Not on file  Social Needs  . Financial resource strain: Not on file  . Food insecurity:    Worry: Not on file    Inability: Not on file  . Transportation needs:    Medical: Not on file    Non-medical: Not on  file  Tobacco Use  . Smoking status: Former Games developer  . Smokeless tobacco: Never Used  Substance and Sexual Activity  . Alcohol use: No    Alcohol/week: 0.0 standard drinks    Comment: Has not drank in several months  . Drug use: Yes    Comment: Clean for several months  . Sexual activity: Never    Birth control/protection: None  Lifestyle  . Physical activity:    Days per week: Not on file    Minutes per session: Not on file  . Stress: Not on file  Relationships  . Social connections:    Talks on phone: Not on file    Gets together: Not on file    Attends religious service: Not on file    Active member of club or organization: Not on file    Attends meetings of clubs or organizations: Not on file    Relationship status: Not on file  . Intimate partner violence:    Fear of current or ex partner: Not on file    Emotionally abused: Not on file    Physically abused: Not on file    Forced sexual activity: Not on file  Other Topics Concern  . Not on file  Social History Narrative  . Not on file   [place] house [place].  Doing a lot of construction work.  Past Medical History, Surgical history, Social history, and Family history were reviewed and updated as appropriate.   Please see review of systems for further details on the patient's review from today.   Objective:   Physical Exam:  BP 127/81 (BP Location: Left Arm)   Pulse (!) 113   Physical Exam Constitutional:      General: She is not in acute distress.    Appearance: She is well-developed.  Musculoskeletal:        General: No deformity.  Neurological:     Mental Status: She is alert and oriented to person, place, and time.     Motor: No tremor or atrophy.     Coordination: Coordination normal.     Gait: Gait normal.  Psychiatric:        Attention and Perception: Attention normal. She is attentive.        Mood and Affect: Mood is anxious and depressed. Affect is not labile, blunt, angry or  inappropriate.        Speech: Speech normal.        Behavior: Behavior normal.        Thought Content: Thought content normal. Thought content does not include homicidal or suicidal ideation. Thought content does not include homicidal or suicidal plan.        Cognition and Memory: Cognition normal.        Judgment: Judgment normal.  Comments: Insight intact. No auditory or visual hallucinations. No delusions.      Lab Review:     Component Value Date/Time   NA 139 07/10/2013 0152   K 3.5 (L) 07/10/2013 0152   CL 102 07/10/2013 0152   CO2 24 07/10/2013 0152   GLUCOSE 99 07/10/2013 0152   BUN 9 07/10/2013 0152   CREATININE 0.85 07/10/2013 0152   CREATININE 0.80 05/03/2012 2206   CALCIUM 9.5 07/10/2013 0152   PROT 7.6 05/03/2012 2206   ALBUMIN 4.3 05/03/2012 2206   AST 38 (H) 05/03/2012 2206   ALT 32 05/03/2012 2206   ALKPHOS 57 05/03/2012 2206   BILITOT 0.3 05/03/2012 2206   GFRNONAA >90 07/10/2013 0152   GFRAA >90 07/10/2013 0152       Component Value Date/Time   WBC 6.8 08/27/2013 1155   RBC 4.11 08/27/2013 1155   HGB 13.1 08/27/2013 1155   HCT 38.0 08/27/2013 1155   PLT 266 08/27/2013 1155   MCV 92.5 08/27/2013 1155   MCV 92.9 05/03/2012 2209   MCH 31.9 08/27/2013 1155   MCHC 34.5 08/27/2013 1155   RDW 12.2 08/27/2013 1155   LYMPHSABS 1.5 07/10/2013 0152   MONOABS 0.7 07/10/2013 0152   EOSABS 0.0 07/10/2013 0152   BASOSABS 0.0 07/10/2013 0152    No results found for: POCLITH, LITHIUM   No results found for: PHENYTOIN, PHENOBARB, VALPROATE, CBMZ   .res Assessment: Plan:    PTSD (post-traumatic stress disorder)  Panic disorder with agoraphobia  Major depressive disorder, recurrent episode, moderate (HCC)  Narcotic dependence, in remission (HCC)  rule out alcohol dependence or abuse  We discussed her multiple diagnoses and medications and options for treatment.  Discussed the risks of the noradrenergic effect of duloxetine to cause some increase in  anxiety.  We also discussed her history of medication sensitivity and how many people with that sensitivity gradually gravitate towards a mean.    She is increased duloxetine to 60 mg and feels like that her focus is better but she is still struggling with motivation and direction for her life and some degree of anxiety.  She does not feel as lethargically depressed and hibernating to the bed as she has with previous depressions.  However she is having difficulty making decisions for the direction of her life.  Her family member see her as depressed.  We discussed the possibility of switching to Trintellix as it has a low weight gain risk and has cognitive benefit as well as a Antidepressant but we will defer that for another month.  Supportive therapy on self-care and maintaining sobriety.  She is committed to sobriety from opiates and other drugs.  We discussed her alcohol use and how that could become a pattern of concern and potential dependence.  We agreed that it was important for her to return to counseling to work on some of these situational issues including now struggling more with a relational break-up.  As well as her career goals and difficulty making progress.  She had seen summer Estes in the past and had felt it was a good fit so she plans to return to see her.  I gave her a couple of other names in case they are needed.  We discussed the goals for counseling.  This was a 35-minute appointment  Follow-up 1 months.  Meredith Staggersarey Cottle MD, DFAPA  Please see After Visit Summary for patient specific instructions.  Future Appointments  Date Time Provider Department Center  04/09/2018  1:00 PM  Cottle, Billey Co., MD [facility] None    No orders of the defined types were placed in this encounter.     -------------------------------

## 2018-03-09 NOTE — Patient Instructions (Signed)
Lorenda CahillKathy Glenn PhD  Rockne Menghiniebbie Dowd, CSW

## 2018-03-15 ENCOUNTER — Encounter: Payer: Self-pay | Admitting: Emergency Medicine

## 2018-03-15 DIAGNOSIS — F4310 Post-traumatic stress disorder, unspecified: Secondary | ICD-10-CM | POA: Insufficient documentation

## 2018-03-15 DIAGNOSIS — F410 Panic disorder [episodic paroxysmal anxiety] without agoraphobia: Secondary | ICD-10-CM | POA: Insufficient documentation

## 2018-03-28 DIAGNOSIS — K649 Unspecified hemorrhoids: Secondary | ICD-10-CM | POA: Diagnosis not present

## 2018-03-28 DIAGNOSIS — F411 Generalized anxiety disorder: Secondary | ICD-10-CM | POA: Diagnosis not present

## 2018-03-28 DIAGNOSIS — K5900 Constipation, unspecified: Secondary | ICD-10-CM | POA: Diagnosis not present

## 2018-03-28 DIAGNOSIS — Z23 Encounter for immunization: Secondary | ICD-10-CM | POA: Diagnosis not present

## 2018-03-28 DIAGNOSIS — Z6828 Body mass index (BMI) 28.0-28.9, adult: Secondary | ICD-10-CM | POA: Diagnosis not present

## 2018-04-09 ENCOUNTER — Encounter: Payer: Self-pay | Admitting: Psychiatry

## 2018-04-09 ENCOUNTER — Ambulatory Visit (INDEPENDENT_AMBULATORY_CARE_PROVIDER_SITE_OTHER): Payer: BLUE CROSS/BLUE SHIELD | Admitting: Psychiatry

## 2018-04-09 DIAGNOSIS — F331 Major depressive disorder, recurrent, moderate: Secondary | ICD-10-CM

## 2018-04-09 DIAGNOSIS — F1121 Opioid dependence, in remission: Secondary | ICD-10-CM | POA: Diagnosis not present

## 2018-04-09 DIAGNOSIS — F4001 Agoraphobia with panic disorder: Secondary | ICD-10-CM

## 2018-04-09 DIAGNOSIS — F4310 Post-traumatic stress disorder, unspecified: Secondary | ICD-10-CM | POA: Diagnosis not present

## 2018-04-09 DIAGNOSIS — G43009 Migraine without aura, not intractable, without status migrainosus: Secondary | ICD-10-CM

## 2018-04-09 MED ORDER — DULOXETINE HCL 60 MG PO CPEP: 60.0000 mg | ORAL_CAPSULE | Freq: Every day | ORAL | 1 refills | Status: DC

## 2018-04-09 MED ORDER — SUMATRIPTAN SUCCINATE 50 MG PO TABS: 50.0000 mg | ORAL_TABLET | ORAL | 6 refills | Status: AC | PRN

## 2018-04-09 NOTE — Progress Notes (Signed)
Monica Jackson [id number] [birth year] 28 y.o.  Subjective:   Patient ID:  Monica PigeonKarlye J Farrior is a 28 y.o. (DOB [birth year]) female.  Chief Complaint:  Chief Complaint  Patient presents with  . Follow-up    Medication management    HPI last seen March 09, 2018 Monica PigeonKarlye J Jarrard presents to the office today for follow-up of PTSD.    On duloxetine 60 mg for over a month.  I feel great.  Other good things have happened.  Still ttired but gets herself up.  Night owl.  More active helping sister and better motivation.    Patient reports stable mood and denies depressed or irritable moods.  Patient denies any recent difficulty with anxiety.  Patient denies difficulty with sleep initiation or maintenance. 7-8 hours.  Denies appetite disturbance.  Patient reports that energy and motivation have been good.  Patient denies any difficulty with concentration.  Patient denies any suicidal ideation.  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    Finds her self struggling in relationships.  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   Been very busy  Real pleased with her success.  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  Maintaining sobriety.  meditation app helps a lot Simple [employer].  HA pretty controlled. 1-2 in last month with congestion.  Past Psychiatric Medication Trials: Amitriptyline, propranolol, fluoxetine, sertraline, Prozac, Effexor 300 mg a day, aripiprazole, topiramate, trazodone is helpful for sleep, duloxetine 60 mg a day, clonazepam. We are avoiding Wellbutrin because of a history of seizures but she was not taking Wellbutrin at the time of the seizures.  Review of Systems:  Review of Systems  Neurological: Negative for tremors and weakness.  Psychiatric/Behavioral: Positive for dysphoric mood. Negative for agitation, behavioral problems, confusion, decreased concentration, hallucinations, self-injury, sleep disturbance and suicidal  ideas. The patient is not nervous/anxious and is not hyperactive.        Irritable.    Medications: I have reviewed the patient's current medications.  Current Outpatient Medications  Medication Sig Dispense Refill  . levonorgestrel (MIRENA, 52 MG,) 20 MCG/24HR IUD 1 each by Intrauterine route once.    . propranolol (INDERAL) 20 MG tablet TAKE 2 TABLETS 3 TIMES DAILY AS NEEDED FOR ANXIETY  2  . SUMAtriptan (IMITREX) 50 MG tablet Take 1 tablet (50 mg total) by mouth every 2 (two) hours as needed for migraine. Take 1 tablet at onset of headache, may repeat in 2 hours 10 tablet 6  . topiramate (TOPAMAX) 100 MG tablet Take 1 tablet twice daily. 180 tablet 0  . traZODone (DESYREL) 50 MG tablet Take 50-100 mg by mouth at bedtime as needed.   1  . doxycycline (VIBRA-TABS) 100 MG tablet Take 100 mg by mouth daily. Take 1 tablet twice daily for skin  2  . DULoxetine (CYMBALTA) 60 MG capsule Take 1 capsule (60 mg total) by mouth daily. 180 capsule 1  . meloxicam (MOBIC) 15 MG tablet Take 1 tablet (15 mg total) by mouth daily. (Patient not taking: Reported on 01/10/2018) 30 tablet 2  . omeprazole (PRILOSEC) 40 MG capsule Take 40 mg by mouth as needed.      No current facility-administered medications for this visit.     Medication Side Effects: None  Allergies:  Allergies  Allergen Reactions  . Sulfa Antibiotics Nausea And Vomiting and Other (See Comments)    Crazy feeling    Past Medical History:  Diagnosis Date  . Allergy   .  Depression   . Seizures (HCC)     Family History  Problem Relation Age of Onset  . Diabetes Mother   . Hyperlipidemia Father   . Diabetes Maternal Grandmother   . Diabetes Maternal Grandfather   . Heart disease Maternal Grandfather   . Cancer Paternal Grandmother   . Hypertension Paternal Grandfather     Social History   Socioeconomic History  . Marital status: Single    Spouse name: Not on file  . Number of children: Not on file  . Years of  education: Not on file  . Highest education level: Not on file  Occupational History  . Not on file  Social Needs  . Financial resource strain: Not on file  . Food insecurity:    Worry: Not on file    Inability: Not on file  . Transportation needs:    Medical: Not on file    Non-medical: Not on file  Tobacco Use  . Smoking status: Former Games developer  . Smokeless tobacco: Never Used  Substance and Sexual Activity  . Alcohol use: No    Alcohol/week: 0.0 standard drinks    Comment: Has not drank in several months  . Drug use: Yes    Comment: Clean for several months  . Sexual activity: Never    Birth control/protection: None  Lifestyle  . Physical activity:    Days per week: Not on file    Minutes per session: Not on file  . Stress: Not on file  Relationships  . Social connections:    Talks on phone: Not on file    Gets together: Not on file    Attends religious service: Not on file    Active member of club or organization: Not on file    Attends meetings of clubs or organizations: Not on file    Relationship status: Not on file  . Intimate partner violence:    Fear of current or ex partner: Not on file    Emotionally abused: Not on file    Physically abused: Not on file    Forced sexual activity: Not on file  Other Topics Concern  . Not on file  Social History Narrative  . Not on file   [place] house [place].  Doing a lot of construction work.  Past Medical History, Surgical history, Social history, and Family history were reviewed and updated as appropriate.   Please see review of systems for further details on the patient's review from today.   Objective:   Physical Exam:  There were no vitals taken for this visit.  Physical Exam Constitutional:      General: She is not in acute distress.    Appearance: She is well-developed.  Musculoskeletal:        General: No deformity.  Neurological:     Mental Status: She is alert and oriented to  person, place, and time.     Motor: No tremor or atrophy.     Coordination: Coordination normal.     Gait: Gait normal.  Psychiatric:        Attention and Perception: Attention normal. She is attentive.        Mood and Affect: Mood is anxious and depressed. Affect is not labile, blunt, angry or inappropriate.        Speech: Speech normal.        Behavior: Behavior normal.        Thought Content: Thought content normal. Thought content does not include  homicidal or suicidal ideation. Thought content does not include homicidal or suicidal plan.        Cognition and Memory: Cognition normal.        Judgment: Judgment normal.     Comments: Insight intact. No auditory or visual hallucinations. No delusions.      Lab Review:     Component Value Date/Time   NA 139 07/10/2013 0152   K 3.5 (L) 07/10/2013 0152   CL 102 07/10/2013 0152   CO2 24 07/10/2013 0152   GLUCOSE 99 07/10/2013 0152   BUN 9 07/10/2013 0152   CREATININE 0.85 07/10/2013 0152   CREATININE 0.80 05/03/2012 2206   CALCIUM 9.5 07/10/2013 0152   PROT 7.6 05/03/2012 2206   ALBUMIN 4.3 05/03/2012 2206   AST 38 (H) 05/03/2012 2206   ALT 32 05/03/2012 2206   ALKPHOS 57 05/03/2012 2206   BILITOT 0.3 05/03/2012 2206   GFRNONAA >90 07/10/2013 0152   GFRAA >90 07/10/2013 0152       Component Value Date/Time   WBC 6.8 08/27/2013 1155   RBC 4.11 08/27/2013 1155   HGB 13.1 08/27/2013 1155   HCT 38.0 08/27/2013 1155   PLT 266 08/27/2013 1155   MCV 92.5 08/27/2013 1155   MCV 92.9 05/03/2012 2209   MCH 31.9 08/27/2013 1155   MCHC 34.5 08/27/2013 1155   RDW 12.2 08/27/2013 1155   LYMPHSABS 1.5 07/10/2013 0152   MONOABS 0.7 07/10/2013 0152   EOSABS 0.0 07/10/2013 0152   BASOSABS 0.0 07/10/2013 0152    No results found for: POCLITH, LITHIUM   No results found for: PHENYTOIN, PHENOBARB, VALPROATE, CBMZ   .res Assessment: Plan:    Major depressive disorder, recurrent episode, moderate (HCC)  PTSD (post-traumatic  stress disorder)  Panic disorder with agoraphobia  Opioid dependence in remission (HCC)  Migraine without aura and without status migrainosus, not intractable  rule out alcohol dependence or abuse  We discussed her multiple diagnoses and medications and options for treatment.  Discussed the risks of the noradrenergic effect of duloxetine to cause some increase in anxiety.  We also discussed her history of medication sensitivity and how many people with that sensitivity gradually gravitate towards a mean.    She has increased duloxetine to 60 mg and feels like that her focus is better and since her last visit her mood and motivation have improved and her excessive sleepiness and sleeping has largely resolved.  She still has a delayed sleep cycle   We discussed the possibility of switching to Trintellix as it has a low weight gain risk and has cognitive benefit as well as a Antidepressant but we will defer that unless it's needed.  Supportive therapy on self-care and maintaining sobriety.  She is committed to sobriety from opiates and other drugs.  We discussed her alcohol use and how that could become a pattern of concern and potential dependence.  We agreed that it was important for her to return to counseling to work on some of these situational issues including now struggling more with a relational break-up.  As well as her career goals and difficulty making progress.    Her migraine headaches are managed with the combination of Topamax and Imitrex.   Follow-up 3 months   Meredith Staggersarey Cottle MD, DFAPA  Please see After Visit Summary for patient specific instructions.  Future Appointments  Date Time Provider Department Center  07/09/2018  1:15 PM Cottle, Steva Readyarey G Jr., MD [facility] None    No orders of the defined  types were placed in this encounter.     -------------------------------

## 2018-05-14 DIAGNOSIS — Z8041 Family history of malignant neoplasm of ovary: Secondary | ICD-10-CM | POA: Diagnosis not present

## 2018-05-14 DIAGNOSIS — Z6830 Body mass index (BMI) 30.0-30.9, adult: Secondary | ICD-10-CM | POA: Diagnosis not present

## 2018-05-14 DIAGNOSIS — Z01419 Encounter for gynecological examination (general) (routine) without abnormal findings: Secondary | ICD-10-CM | POA: Diagnosis not present

## 2018-05-14 DIAGNOSIS — Z113 Encounter for screening for infections with a predominantly sexual mode of transmission: Secondary | ICD-10-CM | POA: Diagnosis not present

## 2018-05-14 DIAGNOSIS — N760 Acute vaginitis: Secondary | ICD-10-CM | POA: Diagnosis not present

## 2018-05-14 DIAGNOSIS — Z803 Family history of malignant neoplasm of breast: Secondary | ICD-10-CM | POA: Diagnosis not present

## 2018-05-25 ENCOUNTER — Other Ambulatory Visit: Payer: Self-pay

## 2018-05-25 MED ORDER — TRAZODONE HCL 50 MG PO TABS: 50.0000 mg | ORAL_TABLET | Freq: Every evening | ORAL | 0 refills | Status: DC | PRN

## 2018-06-13 ENCOUNTER — Other Ambulatory Visit: Payer: Self-pay

## 2018-06-13 MED ORDER — PROPRANOLOL HCL 20 MG PO TABS
ORAL_TABLET | ORAL | 2 refills | Status: DC
Start: 2018-06-13 — End: 2018-09-06

## 2018-07-09 ENCOUNTER — Encounter: Payer: Self-pay | Admitting: Psychiatry

## 2018-07-09 ENCOUNTER — Ambulatory Visit (INDEPENDENT_AMBULATORY_CARE_PROVIDER_SITE_OTHER): Payer: BLUE CROSS/BLUE SHIELD | Admitting: Psychiatry

## 2018-07-09 ENCOUNTER — Other Ambulatory Visit: Payer: Self-pay

## 2018-07-09 DIAGNOSIS — F331 Major depressive disorder, recurrent, moderate: Secondary | ICD-10-CM

## 2018-07-09 DIAGNOSIS — F4310 Post-traumatic stress disorder, unspecified: Secondary | ICD-10-CM

## 2018-07-09 DIAGNOSIS — F1121 Opioid dependence, in remission: Secondary | ICD-10-CM | POA: Diagnosis not present

## 2018-07-09 DIAGNOSIS — G43009 Migraine without aura, not intractable, without status migrainosus: Secondary | ICD-10-CM

## 2018-07-09 DIAGNOSIS — F4001 Agoraphobia with panic disorder: Secondary | ICD-10-CM | POA: Diagnosis not present

## 2018-07-09 NOTE — Progress Notes (Signed)
Monica PigeonKarlye J Jackson [id number] 1990-06-06 28 y.o.  Subjective:   Patient ID:  Monica Jackson is a 28 y.o. (DOB 1990-06-06) female. Virtual Visit via Telephone Note  I connected with pt by telephone and verified that I am speaking with the correct person using two identifiers.   I discussed the limitations, risks, security and privacy concerns of performing an evaluation and management service by telephone and the availability of in person appointments. I also discussed with the patient that there may be a patient responsible charge related to this service. The patient expressed understanding and agreed to proceed.  I discussed the assessment and treatment plan with the patient. The patient was provided an opportunity to ask questions and all were answered. The patient agreed with the plan and demonstrated an understanding of the instructions.   The patient was advised to call back or seek an in-person evaluation if the symptoms worsen or if the condition fails to improve as anticipated.  I provided 15 minutes of non-face-to-face time during this encounter. The call started at 116 and ended at 132. The patient was located at home and the provider was located at office.    Chief Complaint:  Chief Complaint  Patient presents with  . Follow-up    Medication Management  . Anxiety    Increased since Covid 19     Anxiety  Symptoms include nervous/anxious behavior. Patient reports no confusion, decreased concentration or suicidal ideas.     Monica Jackson presents to the office today for follow-up of PTSD.    Last seen April 09, 2018 she had just increase duloxetine to 60 mg and was doing okay overall so we did not change medicines.  Medication wise I'm OK.  Her life has changed with Covid and job completely stopped.  Stuck inside.  Has ex BF who's a cop.  Got afraid and I kind of lost it for a little bit emotionally. She got a little paranoid.  Got super emotional.  Then little sister  hospitalized and mother fell apart.  Now is much more calm.  Has collected chickens and birds.  Trying to stay busy physically.  Patient reports stable mood and denies depressed or irritable moods.  Patient denies difficulty with sleep initiation or maintenance. 7-8 hours.  Once every 2-3 mos won't sleep at all.  Denies appetite disturbance.  Patient reports that energy and motivation have been good.  Patient denies any difficulty with concentration.  Patient denies any suicidal ideation.  Maintaining sobriety.  meditation app helps a lot Simple Habit.  HA pretty controlled. 1-2 in last month with congestion.  Past Psychiatric Medication Trials: Amitriptyline, propranolol, fluoxetine, sertraline, Prozac, Effexor 300 mg a day, aripiprazole, topiramate, trazodone is helpful for sleep, duloxetine 60 mg a day, clonazepam. We are avoiding Wellbutrin because of a history of seizures but she was not taking Wellbutrin at the time of the seizures.  Review of Systems:  Review of Systems  Neurological: Positive for headaches. Negative for tremors and weakness.  Psychiatric/Behavioral: Negative for agitation, behavioral problems, confusion, decreased concentration, dysphoric mood, hallucinations, self-injury, sleep disturbance and suicidal ideas. The patient is nervous/anxious. The patient is not hyperactive.        Irritable.    Medications: I have reviewed the patient's current medications.  Current Outpatient Medications  Medication Sig Dispense Refill  . DULoxetine (CYMBALTA) 60 MG capsule Take 1 capsule (60 mg total) by mouth daily. 180 capsule 1  . levonorgestrel (MIRENA, 52 MG,) 20 MCG/24HR IUD 1  each by Intrauterine route once.    Marland Kitchen omeprazole (PRILOSEC) 40 MG capsule Take 40 mg by mouth as needed.     . propranolol (INDERAL) 20 MG tablet TAKE 2 TABLETS 3 TIMES DAILY AS NEEDED FOR ANXIETY 180 tablet 2  . SUMAtriptan (IMITREX) 50 MG tablet Take 1 tablet (50 mg total) by mouth every 2 (two)  hours as needed for migraine. Take 1 tablet at onset of headache, may repeat in 2 hours 10 tablet 6  . topiramate (TOPAMAX) 100 MG tablet Take 1 tablet twice daily. 180 tablet 0  . traZODone (DESYREL) 50 MG tablet Take 1-2 tablets (50-100 mg total) by mouth at bedtime as needed. 180 tablet 0   No current facility-administered medications for this visit.     Medication Side Effects: None  Allergies:  Allergies  Allergen Reactions  . Sulfa Antibiotics Nausea And Vomiting and Other (See Comments)    Crazy feeling    Past Medical History:  Diagnosis Date  . Allergy   . Depression   . Seizures (HCC)     Family History  Problem Relation Age of Onset  . Diabetes Mother   . Hyperlipidemia Father   . Diabetes Maternal Grandmother   . Diabetes Maternal Grandfather   . Heart disease Maternal Grandfather   . Cancer Paternal Grandmother   . Hypertension Paternal Grandfather     Social History   Socioeconomic History  . Marital status: Single    Spouse name: Not on file  . Number of children: Not on file  . Years of education: Not on file  . Highest education level: Not on file  Occupational History  . Not on file  Social Needs  . Financial resource strain: Not on file  . Food insecurity:    Worry: Not on file    Inability: Not on file  . Transportation needs:    Medical: Not on file    Non-medical: Not on file  Tobacco Use  . Smoking status: Former Games developer  . Smokeless tobacco: Never Used  Substance and Sexual Activity  . Alcohol use: No    Alcohol/week: 0.0 standard drinks    Comment: Has not drank in several months  . Drug use: Yes    Comment: Clean for several months  . Sexual activity: Never    Birth control/protection: None  Lifestyle  . Physical activity:    Days per week: Not on file    Minutes per session: Not on file  . Stress: Not on file  Relationships  . Social connections:    Talks on phone: Not on file    Gets together: Not on file    Attends  religious service: Not on file    Active member of club or organization: Not on file    Attends meetings of clubs or organizations: Not on file    Relationship status: Not on file  . Intimate partner violence:    Fear of current or ex partner: Not on file    Emotionally abused: Not on file    Physically abused: Not on file    Forced sexual activity: Not on file  Other Topics Concern  . Not on file  Social History Narrative  . Not on file   [place] house [place].  Doing a lot of construction work.  Past Medical History, Surgical history, Social history, and Family history were reviewed and updated as appropriate.   Please see review of systems for further details on  the patient's review from today.   Objective:   Physical Exam:  There were no vitals taken for this visit.  Physical Exam Constitutional:      General: She is not in acute distress.    Appearance: She is well-developed.  Musculoskeletal:        General: No deformity.  Neurological:     Mental Status: She is alert and oriented to person, place, and time.     Motor: No tremor or atrophy.     Coordination: Coordination normal.     Gait: Gait normal.  Psychiatric:        Attention and Perception: Attention normal. She is attentive.        Mood and Affect: Mood is anxious and depressed. Affect is not labile, blunt, angry or inappropriate.        Speech: Speech normal.        Behavior: Behavior normal.        Thought Content: Thought content normal. Thought content does not include homicidal or suicidal ideation. Thought content does not include homicidal or suicidal plan.        Cognition and Memory: Cognition normal.        Judgment: Judgment normal.     Comments: Insight intact. No auditory or visual hallucinations. No delusions.      Lab Review:     Component Value Date/Time   NA 139 07/10/2013 0152   K 3.5 (L) 07/10/2013 0152   CL 102 07/10/2013 0152   CO2 24 07/10/2013 0152   GLUCOSE  99 07/10/2013 0152   BUN 9 07/10/2013 0152   CREATININE 0.85 07/10/2013 0152   CREATININE 0.80 05/03/2012 2206   CALCIUM 9.5 07/10/2013 0152   PROT 7.6 05/03/2012 2206   ALBUMIN 4.3 05/03/2012 2206   AST 38 (H) 05/03/2012 2206   ALT 32 05/03/2012 2206   ALKPHOS 57 05/03/2012 2206   BILITOT 0.3 05/03/2012 2206   GFRNONAA >90 07/10/2013 0152   GFRAA >90 07/10/2013 0152       Component Value Date/Time   WBC 6.8 08/27/2013 1155   RBC 4.11 08/27/2013 1155   HGB 13.1 08/27/2013 1155   HCT 38.0 08/27/2013 1155   PLT 266 08/27/2013 1155   MCV 92.5 08/27/2013 1155   MCV 92.9 05/03/2012 2209   MCH 31.9 08/27/2013 1155   MCHC 34.5 08/27/2013 1155   RDW 12.2 08/27/2013 1155   LYMPHSABS 1.5 07/10/2013 0152   MONOABS 0.7 07/10/2013 0152   EOSABS 0.0 07/10/2013 0152   BASOSABS 0.0 07/10/2013 0152    No results found for: POCLITH, LITHIUM   No results found for: PHENYTOIN, PHENOBARB, VALPROATE, CBMZ   .res Assessment: Plan:    Major depressive disorder, recurrent episode, moderate (HCC)  PTSD (post-traumatic stress disorder)  Panic disorder with agoraphobia  Opioid dependence in remission (HCC)  Migraine without aura and without status migrainosus, not intractable  rule out alcohol dependence or abuse  We discussed her multiple diagnoses and medications and options for treatment.  Discussed the risks of the noradrenergic effect of duloxetine to cause some increase in anxiety.  We also discussed her history of medication sensitivity and how many people with that sensitivity gradually gravitate towards a mean.    She has increased duloxetine to 60 mg and feels like that her focus is better and since her last visit her mood and motivation have improved and her excessive sleepiness and sleeping has largely resolved.  She still has a delayed sleep cycle  She does  not want any med changes this visit.  Supportive therapy on self-care and maintaining sobriety.  She is committed to  sobriety from opiates and other drugs.  We discussed her alcohol use and how that could become a pattern of concern and potential dependence.  Supportive therapy around her initial fears associated with Covid and exaggerated emotional reaction.  It seems to be improving at this point  Her migraine headaches are managed with the combination of Topamax and Imitrex.   Follow-up 3 months   Meredith Staggers MD, DFAPA  Please see After Visit Summary for patient specific instructions.  No future appointments.  No orders of the defined types were placed in this encounter.     -------------------------------

## 2018-08-19 ENCOUNTER — Other Ambulatory Visit: Payer: Self-pay | Admitting: Psychiatry

## 2018-09-06 ENCOUNTER — Other Ambulatory Visit: Payer: Self-pay | Admitting: Psychiatry

## 2018-09-21 DIAGNOSIS — Z7183 Encounter for nonprocreative genetic counseling: Secondary | ICD-10-CM | POA: Diagnosis not present

## 2018-09-21 DIAGNOSIS — Z809 Family history of malignant neoplasm, unspecified: Secondary | ICD-10-CM | POA: Diagnosis not present

## 2018-10-08 ENCOUNTER — Ambulatory Visit (INDEPENDENT_AMBULATORY_CARE_PROVIDER_SITE_OTHER): Payer: BC Managed Care – PPO | Admitting: Psychiatry

## 2018-10-08 ENCOUNTER — Encounter: Payer: Self-pay | Admitting: Psychiatry

## 2018-10-08 DIAGNOSIS — F4001 Agoraphobia with panic disorder: Secondary | ICD-10-CM

## 2018-10-08 DIAGNOSIS — F331 Major depressive disorder, recurrent, moderate: Secondary | ICD-10-CM

## 2018-10-08 DIAGNOSIS — G43009 Migraine without aura, not intractable, without status migrainosus: Secondary | ICD-10-CM

## 2018-10-08 DIAGNOSIS — F4310 Post-traumatic stress disorder, unspecified: Secondary | ICD-10-CM | POA: Diagnosis not present

## 2018-10-08 DIAGNOSIS — F1121 Opioid dependence, in remission: Secondary | ICD-10-CM

## 2018-10-08 MED ORDER — DULOXETINE HCL 30 MG PO CPEP: 30.0000 mg | ORAL_CAPSULE | Freq: Every day | ORAL | 1 refills | Status: DC

## 2018-10-08 NOTE — Progress Notes (Signed)
Monica PigeonKarlye J Mowbray [id number] 01-Sep-1990 28 y.o.  Subjective:   Patient ID:  Monica Jackson is a 28 y.o. (DOB 01-Sep-1990) female. Virtual Visit via Telephone Note  I connected with pt by telephone and verified that I am speaking with the correct person using two identifiers.   I discussed the limitations, risks, security and privacy concerns of performing an evaluation and management service by telephone and the availability of in person appointments. I also discussed with the patient that there may be a patient responsible charge related to this service. The patient expressed understanding and agreed to proceed.  I discussed the assessment and treatment plan with the patient. The patient was provided an opportunity to ask questions and all were answered. The patient agreed with the plan and demonstrated an understanding of the instructions.   The patient was advised to call back or seek an in-person evaluation if the symptoms worsen or if the condition fails to improve as anticipated.  I provided 15 minutes of non-face-to-face time during this encounter. The call started at 116 and ended at 132. The patient was located at home and the provider was located at office.    Chief Complaint:  No chief complaint on file.   Anxiety Patient reports no confusion, decreased concentration, nervous/anxious behavior or suicidal ideas.     Monica PigeonKarlye J Spake presents to the office today for follow-up of PTSD.      Not as well.  Goes from poor sleep to excessive sleep in cycles of 3-4 days.  Then may use excessive caffeine to compensate.  This is how she felt before the last increase in duloxetine.  Overall feels more down than in the past.  Even if not sleeping may feel down but it can be variable.  Can respond to external factors easily that influence her mood.  More irritable.  Not impulsive spending or otherwise.  Able to concentrate.  No self destructive thoughts.  No drug abuse.  In  April 09, 2018 she had just increase duloxetine to 60 mg and was doing okay overall so we did not change medicines.   Has collected chickens and birds.  Trying to stay busy physically.  Once every 2-3 mos won't sleep at all.  Denies appetite disturbance.  Patient reports that energy and motivation have been good.  Patient denies any difficulty with concentration.  Patient denies any suicidal ideation.  Maintaining sobriety.  meditation app helps a lot Simple Habit.  HA pretty controlled. 1-2 in last month with congestion.  Past Psychiatric Medication Trials: Amitriptyline, propranolol, fluoxetine, sertraline, Prozac, Effexor 300 mg a day, aripiprazole, topiramate, trazodone is helpful for sleep, duloxetine 60 mg a day, clonazepam. Duloxetine 30 failed. We are avoiding Wellbutrin because of a history of seizures but she was not taking Wellbutrin at the time of the seizures.  Review of Systems:  Review of Systems  Neurological: Negative for tremors, weakness and headaches.  Psychiatric/Behavioral: Positive for dysphoric mood. Negative for agitation, behavioral problems, confusion, decreased concentration, hallucinations, self-injury, sleep disturbance and suicidal ideas. The patient is not nervous/anxious and is not hyperactive.        Irritable.    Medications: I have reviewed the patient's current medications.  Current Outpatient Medications  Medication Sig Dispense Refill  . DULoxetine (CYMBALTA) 60 MG capsule Take 1 capsule (60 mg total) by mouth daily. 180 capsule 1  . levonorgestrel (MIRENA, 52 MG,) 20 MCG/24HR IUD 1 each by Intrauterine route once.    Marland Kitchen. omeprazole (PRILOSEC) 40  MG capsule Take 40 mg by mouth as needed.     . propranolol (INDERAL) 20 MG tablet TAKE 2 TABLETS BY MOUTH 3 TIMES A DAY AS NEEDED FOR ANXIETY 540 tablet 0  . SUMAtriptan (IMITREX) 50 MG tablet Take 1 tablet (50 mg total) by mouth every 2 (two) hours as needed for migraine. Take 1 tablet at onset of headache, may  repeat in 2 hours 10 tablet 6  . topiramate (TOPAMAX) 100 MG tablet TAKE 1 TABLET BY MOUTH TWICE A DAY 180 tablet 0  . traZODone (DESYREL) 50 MG tablet TAKE 1-2 TABLETS (50-100 MG TOTAL) BY MOUTH AT BEDTIME AS NEEDED. 180 tablet 0   No current facility-administered medications for this visit.     Medication Side Effects: None  Allergies:  Allergies  Allergen Reactions  . Sulfa Antibiotics Nausea And Vomiting and Other (See Comments)    Crazy feeling    Past Medical History:  Diagnosis Date  . Allergy   . Depression   . Seizures (HCC)     Family History  Problem Relation Age of Onset  . Diabetes Mother   . Hyperlipidemia Father   . Diabetes Maternal Grandmother   . Diabetes Maternal Grandfather   . Heart disease Maternal Grandfather   . Cancer Paternal Grandmother   . Hypertension Paternal Grandfather     Social History   Socioeconomic History  . Marital status: Single    Spouse name: Not on file  . Number of children: Not on file  . Years of education: Not on file  . Highest education level: Not on file  Occupational History  . Not on file  Social Needs  . Financial resource strain: Not on file  . Food insecurity    Worry: Not on file    Inability: Not on file  . Transportation needs    Medical: Not on file    Non-medical: Not on file  Tobacco Use  . Smoking status: Former Games developermoker  . Smokeless tobacco: Never Used  Substance and Sexual Activity  . Alcohol use: No    Alcohol/week: 0.0 standard drinks    Comment: Has not drank in several months  . Drug use: Yes    Comment: Clean for several months  . Sexual activity: Never    Birth control/protection: None  Lifestyle  . Physical activity    Days per week: Not on file    Minutes per session: Not on file  . Stress: Not on file  Relationships  . Social Musicianconnections    Talks on phone: Not on file    Gets together: Not on file    Attends religious service: Not on file    Active member of club or  organization: Not on file    Attends meetings of clubs or organizations: Not on file    Relationship status: Not on file  . Intimate partner violence    Fear of current or ex partner: Not on file    Emotionally abused: Not on file    Physically abused: Not on file    Forced sexual activity: Not on file  Other Topics Concern  . Not on file  Social History Narrative  . Not on file   [place] a house [place].  Doing a lot of construction work.  Past Medical History, Surgical history, Social history, and Family history were reviewed and updated as appropriate.   Please see review of systems for further details on the patient's review from today.  Objective:   Physical Exam:  There were no vitals taken for this visit.  Physical Exam Neurological:     Mental Status: She is alert and oriented to person, place, and time.     Cranial Nerves: No dysarthria.  Psychiatric:        Attention and Perception: Attention normal.        Mood and Affect: Mood is depressed.        Speech: Speech normal.        Behavior: Behavior is cooperative.        Thought Content: Thought content normal. Thought content is not paranoid or delusional. Thought content does not include homicidal or suicidal ideation. Thought content does not include homicidal or suicidal plan.        Cognition and Memory: Cognition and memory normal.        Judgment: Judgment normal.     Comments: Insight is fair.  She is more depressed and irritable than at the last visit.     Lab Review:     Component Value Date/Time   NA 139 07/10/2013 0152   K 3.5 (L) 07/10/2013 0152   CL 102 07/10/2013 0152   CO2 24 07/10/2013 0152   GLUCOSE 99 07/10/2013 0152   BUN 9 07/10/2013 0152   CREATININE 0.85 07/10/2013 0152   CREATININE 0.80 05/03/2012 2206   CALCIUM 9.5 07/10/2013 0152   PROT 7.6 05/03/2012 2206   ALBUMIN 4.3 05/03/2012 2206   AST 38 (H) 05/03/2012 2206   ALT 32 05/03/2012 2206   ALKPHOS 57  05/03/2012 2206   BILITOT 0.3 05/03/2012 2206   GFRNONAA >90 07/10/2013 0152   GFRAA >90 07/10/2013 0152       Component Value Date/Time   WBC 6.8 08/27/2013 1155   RBC 4.11 08/27/2013 1155   HGB 13.1 08/27/2013 1155   HCT 38.0 08/27/2013 1155   PLT 266 08/27/2013 1155   MCV 92.5 08/27/2013 1155   MCV 92.9 05/03/2012 2209   MCH 31.9 08/27/2013 1155   MCHC 34.5 08/27/2013 1155   RDW 12.2 08/27/2013 1155   LYMPHSABS 1.5 07/10/2013 0152   MONOABS 0.7 07/10/2013 0152   EOSABS 0.0 07/10/2013 0152   BASOSABS 0.0 07/10/2013 0152    No results found for: POCLITH, LITHIUM   No results found for: PHENYTOIN, PHENOBARB, VALPROATE, CBMZ   .res Assessment: Plan:    Chales SalmonKarlye was seen today for depression and sleeping problem.  Diagnoses and all orders for this visit:  Major depressive disorder, recurrent episode, moderate (HCC)  PTSD (post-traumatic stress disorder)  Panic disorder with agoraphobia  Opioid dependence in remission (HCC)  Migraine without aura and without status migrainosus, not intractable  Other orders -     DULoxetine (CYMBALTA) 30 MG capsule; Take 1 capsule (30 mg total) by mouth daily.   We discussed her multiple diagnoses and medications and options for treatment.  She is having some rapid cycling in and out of depression.  Her mother has historically followed a similar pattern when she was younger.  She is not had overt hypomanic symptoms other than the periods of 3 to 4 days of insomnia associated with 3 to 4 days following of hypersomnia.  Discussed the risks of the noradrenergic effect of duloxetine to cause some increase in anxiety and potentially triggering hypomania in patients to have that predisposition..  We also discussed her history of medication sensitivity and how many people with that sensitivity gradually gravitate towards a mean.  This could be what  is happening now.  She  increased duloxetine to 60 mg about January and it seemed to help.  Her  previous problems with mood motivation and excessive sleepiness resolved with the increase at that time but now those symptoms have recurred.    She still has a delayed sleep cycle  Duloxetine increase 90 mg daily.  Disc SE in detail.  Discussed the pros and cons of potential treatment acne with Accutane.  She anticipates taking acne Accutane because her acne is markedly worse than usual.  We discussed the warning that Accutane can sometimes cause depression and suicidal thoughts.  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  She asked questions about this.  If she experiences any problems with depression or suicidal thoughts while on it then discontinue it and contact us in the dermatologist.  Supportive therapy on self-care and maintaining sobriety.  She is committed to sobriety from opiates and other drugs.  We discussed her alcohol use and how that could become a pattern of concern and potential dependence.  Her migraine headaches are managed with the combination of Topamax and Imitrex.  This was a 25-minute appointment  Follow-up 2 months  Lynder Parents MD, DFAPA  Please see After Visit Summary for patient specific instructions.  No future appointments.  No orders of the defined types were placed in this encounter.     -------------------------------

## 2018-10-22 DIAGNOSIS — F331 Major depressive disorder, recurrent, moderate: Secondary | ICD-10-CM | POA: Diagnosis not present

## 2018-10-22 DIAGNOSIS — F411 Generalized anxiety disorder: Secondary | ICD-10-CM | POA: Diagnosis not present

## 2018-10-22 DIAGNOSIS — F4312 Post-traumatic stress disorder, chronic: Secondary | ICD-10-CM | POA: Diagnosis not present

## 2018-11-01 ENCOUNTER — Other Ambulatory Visit: Payer: Self-pay | Admitting: Psychiatry

## 2018-11-06 DIAGNOSIS — F331 Major depressive disorder, recurrent, moderate: Secondary | ICD-10-CM | POA: Diagnosis not present

## 2018-11-07 DIAGNOSIS — F331 Major depressive disorder, recurrent, moderate: Secondary | ICD-10-CM | POA: Diagnosis not present

## 2018-11-07 DIAGNOSIS — F411 Generalized anxiety disorder: Secondary | ICD-10-CM | POA: Diagnosis not present

## 2018-11-07 DIAGNOSIS — F4312 Post-traumatic stress disorder, chronic: Secondary | ICD-10-CM | POA: Diagnosis not present

## 2018-11-12 DIAGNOSIS — L709 Acne, unspecified: Secondary | ICD-10-CM | POA: Diagnosis not present

## 2018-11-13 DIAGNOSIS — F331 Major depressive disorder, recurrent, moderate: Secondary | ICD-10-CM | POA: Diagnosis not present

## 2018-11-15 ENCOUNTER — Other Ambulatory Visit: Payer: Self-pay | Admitting: Psychiatry

## 2018-11-21 DIAGNOSIS — F331 Major depressive disorder, recurrent, moderate: Secondary | ICD-10-CM | POA: Diagnosis not present

## 2018-11-22 DIAGNOSIS — F411 Generalized anxiety disorder: Secondary | ICD-10-CM | POA: Diagnosis not present

## 2018-11-22 DIAGNOSIS — F331 Major depressive disorder, recurrent, moderate: Secondary | ICD-10-CM | POA: Diagnosis not present

## 2018-11-22 DIAGNOSIS — F4312 Post-traumatic stress disorder, chronic: Secondary | ICD-10-CM | POA: Diagnosis not present

## 2018-11-23 DIAGNOSIS — F331 Major depressive disorder, recurrent, moderate: Secondary | ICD-10-CM | POA: Diagnosis not present

## 2018-11-27 DIAGNOSIS — F331 Major depressive disorder, recurrent, moderate: Secondary | ICD-10-CM | POA: Diagnosis not present

## 2018-12-04 DIAGNOSIS — F331 Major depressive disorder, recurrent, moderate: Secondary | ICD-10-CM | POA: Diagnosis not present

## 2018-12-05 ENCOUNTER — Other Ambulatory Visit: Payer: Self-pay | Admitting: Psychiatry

## 2018-12-18 DIAGNOSIS — F331 Major depressive disorder, recurrent, moderate: Secondary | ICD-10-CM | POA: Diagnosis not present

## 2018-12-21 DIAGNOSIS — Z1159 Encounter for screening for other viral diseases: Secondary | ICD-10-CM | POA: Diagnosis not present

## 2018-12-25 DIAGNOSIS — L709 Acne, unspecified: Secondary | ICD-10-CM | POA: Diagnosis not present

## 2018-12-26 DIAGNOSIS — Z0000 Encounter for general adult medical examination without abnormal findings: Secondary | ICD-10-CM | POA: Diagnosis not present

## 2018-12-27 DIAGNOSIS — Z23 Encounter for immunization: Secondary | ICD-10-CM | POA: Diagnosis not present

## 2018-12-27 DIAGNOSIS — N390 Urinary tract infection, site not specified: Secondary | ICD-10-CM | POA: Diagnosis not present

## 2018-12-27 DIAGNOSIS — Z0000 Encounter for general adult medical examination without abnormal findings: Secondary | ICD-10-CM | POA: Diagnosis not present

## 2018-12-27 DIAGNOSIS — Z6829 Body mass index (BMI) 29.0-29.9, adult: Secondary | ICD-10-CM | POA: Diagnosis not present

## 2018-12-28 ENCOUNTER — Other Ambulatory Visit: Payer: Self-pay

## 2018-12-28 DIAGNOSIS — Z20828 Contact with and (suspected) exposure to other viral communicable diseases: Secondary | ICD-10-CM | POA: Diagnosis not present

## 2018-12-28 DIAGNOSIS — Z20822 Contact with and (suspected) exposure to covid-19: Secondary | ICD-10-CM

## 2018-12-30 LAB — NOVEL CORONAVIRUS, NAA: SARS-CoV-2, NAA: NOT DETECTED

## 2019-01-01 DIAGNOSIS — F331 Major depressive disorder, recurrent, moderate: Secondary | ICD-10-CM | POA: Diagnosis not present

## 2019-01-01 DIAGNOSIS — Z20828 Contact with and (suspected) exposure to other viral communicable diseases: Secondary | ICD-10-CM | POA: Diagnosis not present

## 2019-01-01 DIAGNOSIS — R509 Fever, unspecified: Secondary | ICD-10-CM | POA: Diagnosis not present

## 2019-01-01 DIAGNOSIS — R5383 Other fatigue: Secondary | ICD-10-CM | POA: Diagnosis not present

## 2019-01-02 DIAGNOSIS — Z20828 Contact with and (suspected) exposure to other viral communicable diseases: Secondary | ICD-10-CM | POA: Diagnosis not present

## 2019-01-02 DIAGNOSIS — Z6828 Body mass index (BMI) 28.0-28.9, adult: Secondary | ICD-10-CM | POA: Diagnosis not present

## 2019-01-03 DIAGNOSIS — F4312 Post-traumatic stress disorder, chronic: Secondary | ICD-10-CM | POA: Diagnosis not present

## 2019-01-03 DIAGNOSIS — F411 Generalized anxiety disorder: Secondary | ICD-10-CM | POA: Diagnosis not present

## 2019-01-03 DIAGNOSIS — F3341 Major depressive disorder, recurrent, in partial remission: Secondary | ICD-10-CM | POA: Diagnosis not present

## 2019-01-15 DIAGNOSIS — F331 Major depressive disorder, recurrent, moderate: Secondary | ICD-10-CM | POA: Diagnosis not present

## 2019-01-18 DIAGNOSIS — F411 Generalized anxiety disorder: Secondary | ICD-10-CM | POA: Diagnosis not present

## 2019-01-18 DIAGNOSIS — K5900 Constipation, unspecified: Secondary | ICD-10-CM | POA: Diagnosis not present

## 2019-01-18 DIAGNOSIS — K219 Gastro-esophageal reflux disease without esophagitis: Secondary | ICD-10-CM | POA: Diagnosis not present

## 2019-01-22 DIAGNOSIS — F331 Major depressive disorder, recurrent, moderate: Secondary | ICD-10-CM | POA: Diagnosis not present

## 2019-01-29 DIAGNOSIS — F331 Major depressive disorder, recurrent, moderate: Secondary | ICD-10-CM | POA: Diagnosis not present

## 2019-01-30 ENCOUNTER — Other Ambulatory Visit: Payer: Self-pay

## 2019-01-30 DIAGNOSIS — Z20822 Contact with and (suspected) exposure to covid-19: Secondary | ICD-10-CM

## 2019-02-01 LAB — NOVEL CORONAVIRUS, NAA: SARS-CoV-2, NAA: DETECTED — AB

## 2019-02-12 DIAGNOSIS — L709 Acne, unspecified: Secondary | ICD-10-CM | POA: Diagnosis not present

## 2019-02-12 DIAGNOSIS — Z79899 Other long term (current) drug therapy: Secondary | ICD-10-CM | POA: Diagnosis not present

## 2019-02-12 DIAGNOSIS — T8140XA Infection following a procedure, unspecified, initial encounter: Secondary | ICD-10-CM | POA: Diagnosis not present

## 2019-02-12 DIAGNOSIS — E785 Hyperlipidemia, unspecified: Secondary | ICD-10-CM | POA: Diagnosis not present

## 2019-02-12 DIAGNOSIS — L080 Pyoderma: Secondary | ICD-10-CM | POA: Diagnosis not present

## 2019-02-12 DIAGNOSIS — L700 Acne vulgaris: Secondary | ICD-10-CM | POA: Diagnosis not present

## 2019-02-12 DIAGNOSIS — Z5181 Encounter for therapeutic drug level monitoring: Secondary | ICD-10-CM | POA: Diagnosis not present

## 2019-02-12 DIAGNOSIS — L089 Local infection of the skin and subcutaneous tissue, unspecified: Secondary | ICD-10-CM | POA: Diagnosis not present

## 2019-02-15 DIAGNOSIS — F411 Generalized anxiety disorder: Secondary | ICD-10-CM | POA: Diagnosis not present

## 2019-02-15 DIAGNOSIS — U071 COVID-19: Secondary | ICD-10-CM | POA: Diagnosis not present

## 2019-02-15 DIAGNOSIS — F329 Major depressive disorder, single episode, unspecified: Secondary | ICD-10-CM | POA: Diagnosis not present

## 2019-02-15 DIAGNOSIS — K5900 Constipation, unspecified: Secondary | ICD-10-CM | POA: Diagnosis not present

## 2019-02-15 DIAGNOSIS — F331 Major depressive disorder, recurrent, moderate: Secondary | ICD-10-CM | POA: Diagnosis not present

## 2019-02-26 DIAGNOSIS — F331 Major depressive disorder, recurrent, moderate: Secondary | ICD-10-CM | POA: Diagnosis not present

## 2019-03-05 ENCOUNTER — Other Ambulatory Visit: Payer: Self-pay | Admitting: Psychiatry

## 2019-03-05 NOTE — Telephone Encounter (Signed)
Check refill 

## 2019-03-05 NOTE — Telephone Encounter (Signed)
She needs an appt. Was supposed to return in September. No future appt. Should I approve but leave a note needs appt. Or deny?

## 2019-03-05 NOTE — Telephone Encounter (Signed)
She's overdue for follow up, last here July. 90 day or 30 day?

## 2019-03-20 DIAGNOSIS — L709 Acne, unspecified: Secondary | ICD-10-CM | POA: Diagnosis not present

## 2019-03-20 DIAGNOSIS — Z5181 Encounter for therapeutic drug level monitoring: Secondary | ICD-10-CM | POA: Diagnosis not present

## 2019-03-20 DIAGNOSIS — L700 Acne vulgaris: Secondary | ICD-10-CM | POA: Diagnosis not present

## 2019-03-20 DIAGNOSIS — L309 Dermatitis, unspecified: Secondary | ICD-10-CM | POA: Diagnosis not present

## 2019-03-20 DIAGNOSIS — K130 Diseases of lips: Secondary | ICD-10-CM | POA: Diagnosis not present

## 2019-03-22 DIAGNOSIS — F331 Major depressive disorder, recurrent, moderate: Secondary | ICD-10-CM | POA: Diagnosis not present

## 2019-03-30 DIAGNOSIS — F331 Major depressive disorder, recurrent, moderate: Secondary | ICD-10-CM | POA: Diagnosis not present

## 2019-04-05 DIAGNOSIS — J3489 Other specified disorders of nose and nasal sinuses: Secondary | ICD-10-CM | POA: Diagnosis not present

## 2019-04-05 DIAGNOSIS — F331 Major depressive disorder, recurrent, moderate: Secondary | ICD-10-CM | POA: Diagnosis not present

## 2019-04-05 DIAGNOSIS — J310 Chronic rhinitis: Secondary | ICD-10-CM | POA: Diagnosis not present

## 2019-04-05 DIAGNOSIS — J342 Deviated nasal septum: Secondary | ICD-10-CM | POA: Diagnosis not present

## 2019-04-05 DIAGNOSIS — J343 Hypertrophy of nasal turbinates: Secondary | ICD-10-CM | POA: Diagnosis not present

## 2019-04-09 DIAGNOSIS — J310 Chronic rhinitis: Secondary | ICD-10-CM | POA: Insufficient documentation

## 2019-04-09 DIAGNOSIS — J343 Hypertrophy of nasal turbinates: Secondary | ICD-10-CM | POA: Insufficient documentation

## 2019-04-09 DIAGNOSIS — J3489 Other specified disorders of nose and nasal sinuses: Secondary | ICD-10-CM | POA: Insufficient documentation

## 2019-04-09 DIAGNOSIS — J342 Deviated nasal septum: Secondary | ICD-10-CM | POA: Insufficient documentation

## 2019-04-22 DIAGNOSIS — L309 Dermatitis, unspecified: Secondary | ICD-10-CM | POA: Diagnosis not present

## 2019-04-22 DIAGNOSIS — L709 Acne, unspecified: Secondary | ICD-10-CM | POA: Diagnosis not present

## 2019-04-22 DIAGNOSIS — Z5181 Encounter for therapeutic drug level monitoring: Secondary | ICD-10-CM | POA: Diagnosis not present

## 2019-04-29 DIAGNOSIS — J310 Chronic rhinitis: Secondary | ICD-10-CM | POA: Diagnosis not present

## 2019-04-29 DIAGNOSIS — R05 Cough: Secondary | ICD-10-CM | POA: Diagnosis not present

## 2019-04-29 DIAGNOSIS — J3089 Other allergic rhinitis: Secondary | ICD-10-CM | POA: Diagnosis not present

## 2019-04-29 DIAGNOSIS — J301 Allergic rhinitis due to pollen: Secondary | ICD-10-CM | POA: Diagnosis not present

## 2019-05-01 DIAGNOSIS — F4312 Post-traumatic stress disorder, chronic: Secondary | ICD-10-CM | POA: Diagnosis not present

## 2019-05-01 DIAGNOSIS — F3341 Major depressive disorder, recurrent, in partial remission: Secondary | ICD-10-CM | POA: Diagnosis not present

## 2019-05-01 DIAGNOSIS — F411 Generalized anxiety disorder: Secondary | ICD-10-CM | POA: Diagnosis not present

## 2019-05-10 DIAGNOSIS — F331 Major depressive disorder, recurrent, moderate: Secondary | ICD-10-CM | POA: Diagnosis not present

## 2019-05-22 DIAGNOSIS — K130 Diseases of lips: Secondary | ICD-10-CM | POA: Diagnosis not present

## 2019-05-22 DIAGNOSIS — Z5181 Encounter for therapeutic drug level monitoring: Secondary | ICD-10-CM | POA: Diagnosis not present

## 2019-05-22 DIAGNOSIS — L709 Acne, unspecified: Secondary | ICD-10-CM | POA: Diagnosis not present

## 2019-05-30 DIAGNOSIS — F331 Major depressive disorder, recurrent, moderate: Secondary | ICD-10-CM | POA: Diagnosis not present

## 2019-06-12 DIAGNOSIS — F4312 Post-traumatic stress disorder, chronic: Secondary | ICD-10-CM | POA: Diagnosis not present

## 2019-06-12 DIAGNOSIS — F331 Major depressive disorder, recurrent, moderate: Secondary | ICD-10-CM | POA: Diagnosis not present

## 2019-06-12 DIAGNOSIS — F411 Generalized anxiety disorder: Secondary | ICD-10-CM | POA: Diagnosis not present

## 2019-06-20 DIAGNOSIS — F331 Major depressive disorder, recurrent, moderate: Secondary | ICD-10-CM | POA: Diagnosis not present

## 2019-06-24 ENCOUNTER — Ambulatory Visit: Payer: BC Managed Care – PPO | Admitting: Dermatology

## 2019-06-24 ENCOUNTER — Other Ambulatory Visit: Payer: Self-pay

## 2019-06-24 DIAGNOSIS — L700 Acne vulgaris: Secondary | ICD-10-CM

## 2019-06-24 MED ORDER — CLARAVIS 40 MG PO CAPS: 40.0000 mg | ORAL_CAPSULE | Freq: Every day | ORAL | 0 refills | Status: DC

## 2019-06-25 LAB — PREGNANCY, URINE: Preg Test, Ur: NEGATIVE

## 2019-06-25 NOTE — Progress Notes (Signed)
   Follow-Up Visit   Subjective  Monica Jackson is a 29 y.o. female who presents for the following: Acne (Here for isotretinoin follow up ).  acne Location: Mostly face Duration: Years Quality: Almost clear Associated Signs/Symptoms: Modifying Factors: Isotretinoin; good tolerance (peeling lips) Severity:  Timing: Context: Good understanding of need to avoid pregnancy and to sun protect  The following portions of the chart were reviewed this encounter and updated as appropriate:     Objective  Well appearing patient in no apparent distress; mood and affect are within normal limits.  A focused examination was performed including head and neck. Relevant physical exam findings are noted in the Assessment and Plan.   Assessment & Plan  Acne vulgaris Head - Anterior (Face)  Other Related Procedures Pregnancy, urine  continue same dose isotretinoin, call with any issues.

## 2019-07-04 DIAGNOSIS — F331 Major depressive disorder, recurrent, moderate: Secondary | ICD-10-CM | POA: Diagnosis not present

## 2019-07-24 ENCOUNTER — Ambulatory Visit: Payer: Self-pay | Admitting: Dermatology

## 2019-07-29 ENCOUNTER — Ambulatory Visit: Payer: BC Managed Care – PPO | Admitting: Dermatology

## 2019-07-31 ENCOUNTER — Other Ambulatory Visit: Payer: Self-pay

## 2019-07-31 ENCOUNTER — Ambulatory Visit: Payer: BC Managed Care – PPO | Admitting: Dermatology

## 2019-07-31 DIAGNOSIS — L700 Acne vulgaris: Secondary | ICD-10-CM

## 2019-07-31 DIAGNOSIS — L309 Dermatitis, unspecified: Secondary | ICD-10-CM | POA: Diagnosis not present

## 2019-07-31 MED ORDER — CLARAVIS 40 MG PO CAPS: 40.0000 mg | ORAL_CAPSULE | Freq: Every day | ORAL | 0 refills | Status: DC

## 2019-08-01 LAB — PREGNANCY, URINE: Preg Test, Ur: NEGATIVE

## 2019-08-04 ENCOUNTER — Encounter: Payer: Self-pay | Admitting: Dermatology

## 2019-08-04 NOTE — Progress Notes (Signed)
   Follow-Up Visit   Subjective  EFFIE WAHLERT is a 29 y.o. female who presents for the following: Acne (isotretinoin follow up. Rash around mouth and chin, patient used to the dryness but the rash was like red bumps under skin. used hydrocortisone. ).  Acne Location: Face Duration: Years Quality:  Associated Signs/Symptoms: Modifying Factors: Isotretinoin Severity:  Timing: Context:   The following portions of the chart were reviewed this encounter and updated as appropriate: Tobacco  Allergies  Meds  Problems  Med Hx  Surg Hx  Fam Hx      Objective  Well appearing patient in no apparent distress; mood and affect are within normal limits.  A focused examination was performed including The neck examined. Relevant physical exam findings are noted in the Assessment and Plan.   Assessment & Plan  Acne vulgaris Head - Anterior (Face)  Continue isotretinoin to reach target dose.  Sun protect and avoid pregnancy.  Other Related Procedures Pregnancy, urine  Reordered Medications CLARAVIS 40 MG capsule  Dermatitis (2) Right Oral Commissure; Left Buccal Cheek   Recheck if recurs or may repeat short-term use of nonprescription hydrocortisone.

## 2019-08-08 DIAGNOSIS — M6289 Other specified disorders of muscle: Secondary | ICD-10-CM | POA: Insufficient documentation

## 2019-08-08 DIAGNOSIS — Z3202 Encounter for pregnancy test, result negative: Secondary | ICD-10-CM | POA: Diagnosis not present

## 2019-08-08 DIAGNOSIS — N939 Abnormal uterine and vaginal bleeding, unspecified: Secondary | ICD-10-CM | POA: Diagnosis not present

## 2019-08-08 DIAGNOSIS — Z01419 Encounter for gynecological examination (general) (routine) without abnormal findings: Secondary | ICD-10-CM | POA: Diagnosis not present

## 2019-08-08 DIAGNOSIS — Z6829 Body mass index (BMI) 29.0-29.9, adult: Secondary | ICD-10-CM | POA: Diagnosis not present

## 2019-09-02 ENCOUNTER — Ambulatory Visit: Payer: BC Managed Care – PPO | Admitting: Physician Assistant

## 2019-09-02 ENCOUNTER — Other Ambulatory Visit: Payer: Self-pay

## 2019-09-02 ENCOUNTER — Encounter: Payer: Self-pay | Admitting: Physician Assistant

## 2019-09-02 DIAGNOSIS — L700 Acne vulgaris: Secondary | ICD-10-CM | POA: Diagnosis not present

## 2019-09-02 MED ORDER — CLARAVIS 40 MG PO CAPS: 40.0000 mg | ORAL_CAPSULE | Freq: Every day | ORAL | 0 refills | Status: DC

## 2019-09-02 NOTE — Progress Notes (Signed)
Patient wanted to let us know that she has a pelvic floor dysfunction and had to take a pill called Methenamine: sodium acid phosphate methylene blue and her urine will be blue for the specimen.

## 2019-09-02 NOTE — Progress Notes (Signed)
   Isotretinoin Follow-Up Visit   Subjective  Monica Jackson is a 29 y.o. female who presents for the following: Acne (isotretinoin follow up.). She is doing well and is having very few new bumps. No out of the ordinary headaches. Mild lower back stiffness.    The following portions of the chart were reviewed this encounter and updated as appropriate:      Isotretinoin F/U - 09/02/19 1500      Isotretinoin Follow Up   iPledge # [id number]    Date 09/02/19    Two Forms of Birth Control IUD;Female Condom      Dosage   Target Dosage (mg) 37106    Current (To Date) Dosage (mg) 6000    To Go Dosage (mg) 4368           Review of Systems: No other skin or systemic complaints.  Objective  Well appearing patient in no apparent distress; mood and affect are within normal limits.  Face examined. Relevant physical exam findings are noted in the Assessment and Plan.  Objective  Head - Anterior (Face): Erythematous papules and pustules with comedones. Very little active acne.   Assessment & Plan  Acne vulgaris Head - Anterior (Face)  Pregnancy, urine - Head - Anterior (Face)  CLARAVIS 40 MG capsule - Head - Anterior (Face)

## 2019-09-03 LAB — PREGNANCY, URINE: Preg Test, Ur: NEGATIVE

## 2019-09-18 ENCOUNTER — Encounter: Payer: Self-pay | Admitting: Gastroenterology

## 2019-09-18 DIAGNOSIS — E663 Overweight: Secondary | ICD-10-CM | POA: Diagnosis not present

## 2019-09-18 DIAGNOSIS — R109 Unspecified abdominal pain: Secondary | ICD-10-CM | POA: Diagnosis not present

## 2019-09-18 DIAGNOSIS — K580 Irritable bowel syndrome with diarrhea: Secondary | ICD-10-CM | POA: Diagnosis not present

## 2019-09-18 DIAGNOSIS — F411 Generalized anxiety disorder: Secondary | ICD-10-CM | POA: Diagnosis not present

## 2019-09-19 ENCOUNTER — Other Ambulatory Visit: Payer: Self-pay | Admitting: Family Medicine

## 2019-09-19 DIAGNOSIS — R1013 Epigastric pain: Secondary | ICD-10-CM

## 2019-09-26 ENCOUNTER — Ambulatory Visit
Admission: RE | Admit: 2019-09-26 | Discharge: 2019-09-26 | Disposition: A | Discharge status: Home / Self Care | Payer: BC Managed Care – PPO | Source: Ambulatory Visit | Attending: Family Medicine | Admitting: Family Medicine

## 2019-09-26 DIAGNOSIS — Q600 Renal agenesis, unilateral: Secondary | ICD-10-CM | POA: Diagnosis not present

## 2019-09-26 DIAGNOSIS — G8929 Other chronic pain: Secondary | ICD-10-CM | POA: Diagnosis not present

## 2019-09-26 DIAGNOSIS — R1013 Epigastric pain: Secondary | ICD-10-CM

## 2019-09-27 DIAGNOSIS — K589 Irritable bowel syndrome without diarrhea: Secondary | ICD-10-CM | POA: Diagnosis not present

## 2019-09-27 DIAGNOSIS — F411 Generalized anxiety disorder: Secondary | ICD-10-CM | POA: Diagnosis not present

## 2019-09-27 DIAGNOSIS — K580 Irritable bowel syndrome with diarrhea: Secondary | ICD-10-CM | POA: Diagnosis not present

## 2019-09-27 DIAGNOSIS — Z2821 Immunization not carried out because of patient refusal: Secondary | ICD-10-CM | POA: Diagnosis not present

## 2019-10-08 ENCOUNTER — Other Ambulatory Visit: Payer: Self-pay

## 2019-10-08 ENCOUNTER — Ambulatory Visit: Payer: BC Managed Care – PPO | Admitting: Dermatology

## 2019-10-08 DIAGNOSIS — L700 Acne vulgaris: Secondary | ICD-10-CM

## 2019-10-09 LAB — PREGNANCY, URINE: Preg Test, Ur: NEGATIVE

## 2019-10-10 ENCOUNTER — Telehealth: Payer: Self-pay | Admitting: Dermatology

## 2019-10-10 DIAGNOSIS — F4312 Post-traumatic stress disorder, chronic: Secondary | ICD-10-CM | POA: Diagnosis not present

## 2019-10-10 DIAGNOSIS — F411 Generalized anxiety disorder: Secondary | ICD-10-CM | POA: Diagnosis not present

## 2019-10-10 DIAGNOSIS — F3341 Major depressive disorder, recurrent, in partial remission: Secondary | ICD-10-CM | POA: Diagnosis not present

## 2019-10-10 NOTE — Telephone Encounter (Signed)
Patient calling to ask [place] to put pregnancy test results in ipledge so she can pick up her medication from the pharmacy.

## 2019-10-11 NOTE — Telephone Encounter (Signed)
Left message patient was entered in ipledge and she can answer her questions and fill her RX.

## 2019-10-13 ENCOUNTER — Other Ambulatory Visit: Payer: Self-pay | Admitting: Physician Assistant

## 2019-10-13 DIAGNOSIS — L700 Acne vulgaris: Secondary | ICD-10-CM

## 2019-10-14 ENCOUNTER — Other Ambulatory Visit: Payer: Self-pay

## 2019-10-14 DIAGNOSIS — L700 Acne vulgaris: Secondary | ICD-10-CM

## 2019-10-14 MED ORDER — CLARAVIS 40 MG PO CAPS: 40.0000 mg | ORAL_CAPSULE | Freq: Every day | ORAL | 0 refills | Status: DC

## 2019-11-11 ENCOUNTER — Other Ambulatory Visit: Payer: Self-pay

## 2019-11-11 ENCOUNTER — Encounter: Payer: Self-pay | Admitting: Dermatology

## 2019-11-11 ENCOUNTER — Ambulatory Visit: Payer: BC Managed Care – PPO | Admitting: Dermatology

## 2019-11-11 DIAGNOSIS — L700 Acne vulgaris: Secondary | ICD-10-CM

## 2019-11-11 MED ORDER — CLARAVIS 40 MG PO CAPS: 40.0000 mg | ORAL_CAPSULE | Freq: Every day | ORAL | 0 refills | Status: DC

## 2019-11-12 DIAGNOSIS — R109 Unspecified abdominal pain: Secondary | ICD-10-CM | POA: Insufficient documentation

## 2019-11-12 DIAGNOSIS — K589 Irritable bowel syndrome without diarrhea: Secondary | ICD-10-CM | POA: Insufficient documentation

## 2019-11-12 LAB — PREGNANCY, URINE: Preg Test, Ur: NEGATIVE

## 2019-11-13 ENCOUNTER — Ambulatory Visit: Payer: BC Managed Care – PPO | Admitting: Gastroenterology

## 2019-11-13 ENCOUNTER — Other Ambulatory Visit (INDEPENDENT_AMBULATORY_CARE_PROVIDER_SITE_OTHER): Payer: BC Managed Care – PPO

## 2019-11-13 ENCOUNTER — Encounter: Payer: Self-pay | Admitting: Gastroenterology

## 2019-11-13 DIAGNOSIS — K625 Hemorrhage of anus and rectum: Secondary | ICD-10-CM

## 2019-11-13 DIAGNOSIS — K5900 Constipation, unspecified: Secondary | ICD-10-CM | POA: Diagnosis not present

## 2019-11-13 DIAGNOSIS — R197 Diarrhea, unspecified: Secondary | ICD-10-CM

## 2019-11-13 DIAGNOSIS — R194 Change in bowel habit: Secondary | ICD-10-CM

## 2019-11-13 DIAGNOSIS — Z01818 Encounter for other preprocedural examination: Secondary | ICD-10-CM

## 2019-11-13 LAB — CBC
HCT: 41.1 % (ref 36.0–46.0)
Hemoglobin: 13.8 g/dL (ref 12.0–15.0)
MCHC: 33.7 g/dL (ref 30.0–36.0)
MCV: 93.9 fl (ref 78.0–100.0)
Platelets: 310 10*3/uL (ref 150.0–400.0)
RBC: 4.38 Mil/uL (ref 3.87–5.11)
RDW: 12.6 % (ref 11.5–15.5)
WBC: 6 10*3/uL (ref 4.0–10.5)

## 2019-11-13 LAB — COMPREHENSIVE METABOLIC PANEL
ALT: 12 U/L (ref 0–35)
AST: 16 U/L (ref 0–37)
Albumin: 4.9 g/dL (ref 3.5–5.2)
Alkaline Phosphatase: 72 U/L (ref 39–117)
BUN: 11 mg/dL (ref 6–23)
CO2: 26 mEq/L (ref 19–32)
Calcium: 9.5 mg/dL (ref 8.4–10.5)
Chloride: 106 mEq/L (ref 96–112)
Creatinine, Ser: 0.97 mg/dL (ref 0.40–1.20)
GFR: 68.95 mL/min (ref >60.00)
Glucose, Bld: 112 mg/dL — ABNORMAL HIGH (ref 70–99)
Potassium: 3.4 mEq/L — ABNORMAL LOW (ref 3.5–5.1)
Sodium: 140 mEq/L (ref 135–145)
Total Bilirubin: 0.3 mg/dL (ref 0.2–1.2)
Total Protein: 7.6 g/dL (ref 6.0–8.3)

## 2019-11-13 LAB — SEDIMENTATION RATE: Sed Rate: 13 mm/hr (ref 0–20)

## 2019-11-13 NOTE — Patient Instructions (Addendum)
If you are age 27 or older, your body mass index should be between 23-30. Your Body mass index is 28.68 kg/m. If this is out of the aforementioned range listed, please consider follow up with your Primary Care Provider.  If you are age 57 or younger, your body mass index should be between 19-25. Your Body mass index is 28.68 kg/m. If this is out of the aformentioned range listed, please consider follow up with your Primary Care Provider.   Your provider has requested that you go to the basement level for lab work before leaving today. Press "B" on the elevator. The lab is located at the first door on the left as you exit the elevator.  You have been scheduled for a colonoscopy. Please follow written instructions given to you at your visit today.  Please pick up your prep supplies at the pharmacy within the next 1-3 days. If you use inhalers (even only as needed), please bring them with you on the day of your procedure.  Due to recent changes in healthcare laws, you may see the results of your imaging and laboratory studies on MyChart before your provider has had a chance to review them.  We understand that in some cases there may be results that are confusing or concerning to you. Not all laboratory results come back in the same time frame and the provider may be waiting for multiple results in order to interpret others.  Please give [place] 48 hours in order for your provider to thoroughly review all the results before contacting the office for clarification of your results.   Please purchase the following medications over the counter and take as directed:  START: taking citrucel (orange flavored) powder fiber supplement.  This may cause some bloating at first but that usually goes away. Begin with a small spoonful and work your way up to a large, heaping spoonful daily over a week.   Thank you for entrusting me with your care and choosing [facility].  Dr Christella Hartigan

## 2019-11-13 NOTE — Addendum Note (Signed)
Addended by: Lamona Curl on: 11/13/2019 02:56 PM   Modules accepted: Orders

## 2019-11-13 NOTE — Progress Notes (Signed)
HPI: This is a very pleasant 29 year old woman who was referred to me by Lewis Moccasin, MD  to evaluate alternating constipation, diarrhea, minor rectal bleeding.    When she was younger she had significant constipation.  She tells me she at one point did move her bowels for 18 days.  In the past several months she has had alternating constipation and diarrhea.  She can go 2 or 3 days without moving her bowels at all and then she will have urgency, lower abdominal pains, bloating and then diarrhea.  She is seen blood in her stool on more than one occasion.  Between these episodes she is not very bothered by abdominal pains.  Former IV drug abuser, she is been abstinent for for 5 years now.  She has gained 40 pounds since she went to rehab.  She is currently not on any meds to affect her bowels.  She believes she had a fissure in the past and now it is more like a hemorrhoid she believes.  She is a singer and travels  Old Data Reviewed: July 2021 abdominal ultrasound indication chronic epigastric abdominal pain, findings normal abdominal ultrasound   Review of systems: Pertinent positive and negative review of systems were noted in the above HPI section. All other review negative.   Past Medical History:  Diagnosis Date  . Allergy   . Anxiety   . Depression   . IBS (irritable bowel syndrome)   . Seizures (HCC)     Past Surgical History:  Procedure Laterality Date  . COSMETIC SURGERY    . LEEP N/A 08/27/2013   Procedure: LOOP ELECTROSURGICAL EXCISION PROCEDURE (LEEP) CONE BIOPSY;  Surgeon: Jeani Hawking, MD;  Location: [facility];  Service: Gynecology;  Laterality: N/A;    Current Outpatient Medications  Medication Sig Dispense Refill  . clonazePAM (KLONOPIN) 0.5 MG tablet Take 0.5 mg by mouth 2 (two) times daily.    Marland Kitchen desvenlafaxine (PRISTIQ) 100 MG 24 hr tablet Take by mouth.     . docusate sodium (COLACE) 100 MG capsule Take 100 mg by mouth daily.    . fluticasone  (FLONASE) 50 MCG/ACT nasal spray 2 sprays by Each Nare route daily.    . ISOtretinoin (CLARAVIS) 40 MG capsule Take 40 mg by mouth 2 (two) times daily.    Marland Kitchen levocetirizine (XYZAL) 5 MG tablet SMARTSIG:1 Tablet(s) By Mouth Every Evening    . levonorgestrel (MIRENA, 52 MG,) 20 MCG/24HR IUD 1 each by Intrauterine route once.    . prazosin (MINIPRESS) 1 MG capsule TAKE 1 CAPSULE BY MOUTH EVERYDAY AT BEDTIME    . SUMAtriptan (IMITREX) 50 MG tablet Take 1 tablet (50 mg total) by mouth every 2 (two) hours as needed for migraine. Take 1 tablet at onset of headache, may repeat in 2 hours 10 tablet 6  . topiramate (TOPAMAX) 100 MG tablet TAKE 1 TABLET BY MOUTH TWICE A DAY 180 tablet 0  . traZODone (DESYREL) 50 MG tablet TAKE 1-2 TABLETS BY MOUTH AT BEDTIME AS NEEDED. 180 tablet 0   No current facility-administered medications for this visit.    Allergies as of 11/13/2019 - Review Complete 11/13/2019  Allergen Reaction Noted  . Sulfa antibiotics Nausea And Vomiting and Other (See Comments) 01/21/2013    Family History  Problem Relation Age of Onset  . Diabetes Mother   . Hyperlipidemia Father   . Diabetes Maternal Grandmother   . Diabetes Maternal Grandfather   . Heart disease Maternal Grandfather   . Cancer  Paternal Grandmother   . Hypertension Paternal Grandfather   . Colon cancer Neg Hx   . Stomach cancer Neg Hx   . Pancreatic cancer Neg Hx   . Rectal cancer Neg Hx     Social History   Socioeconomic History  . Marital status: Single    Spouse name: Not on file  . Number of children: Not on file  . Years of education: Not on file  . Highest education level: Not on file  Occupational History  . Not on file  Tobacco Use  . Smoking status: Former Games developer  . Smokeless tobacco: Never Used  Vaping Use  . Vaping Use: Former  Substance and Sexual Activity  . Alcohol use: Yes    Alcohol/week: 0.0 standard drinks    Comment: occasional  . Drug use: Not Currently    Comment: Clean for  several months  . Sexual activity: Never    Birth control/protection: None  Other Topics Concern  . Not on file  Social History Narrative  . Not on file   Social Determinants of Health   Financial Resource Strain:   . Difficulty of Paying Living Expenses: Not on file  Food Insecurity:   . Worried About Programme researcher, broadcasting/film/video in the Last Year: Not on file  . Ran Out of Food in the Last Year: Not on file  Transportation Needs:   . Lack of Transportation (Medical): Not on file  . Lack of Transportation (Non-Medical): Not on file  Physical Activity:   . Days of Exercise per Week: Not on file  . Minutes of Exercise per Session: Not on file  Stress:   . Feeling of Stress : Not on file  Social Connections:   . Frequency of Communication with Friends and Family: Not on file  . Frequency of Social Gatherings with Friends and Family: Not on file  . Attends Religious Services: Not on file  . Active Member of Clubs or Organizations: Not on file  . Attends Banker Meetings: Not on file  . Marital Status: Not on file  Intimate Partner Violence:   . Fear of Current or Ex-Partner: Not on file  . Emotionally Abused: Not on file  . Physically Abused: Not on file  . Sexually Abused: Not on file     Physical Exam: BP 122/76   Pulse 93   Ht 5' 8.5" (1.74 m)   Wt 191 lb 6 oz (86.8 kg)   BMI 28.68 kg/m  Constitutional: generally well-appearing Psychiatric: alert and oriented x3 Eyes: extraocular movements intact Mouth: oral pharynx moist, no lesions Neck: supple no lymphadenopathy Cardiovascular: heart regular rate and rhythm Lungs: clear to auscultation bilaterally Abdomen: soft, mild right lower quadrant tenderness, nondistended, no obvious ascites, no peritoneal signs, normal bowel sounds Extremities: no lower extremity edema bilaterally Skin: no lesions on visible extremities   Assessment and plan: 29 y.o. female with alternating constipation, diarrhea, abdominal  pains, intermittent bright red blood per rectum.  I do think there is a good chance that she has alternating IBS however with the blood that she has intermittently seen I recommended colonoscopy to make sure we are not dealing with anything else such as colitis, Crohn's disease.  I think it is very unlikely that she has a neoplasm.  She will get blood work today including a CBC, complete metabolic profile and sed rate.  She will start fiber supplements now and hopefully will be able to update me on her progress at the time  of colonoscopy    Please see the "Patient Instructions" section for addition details about the plan.   Rob Bunting, MD [facility] 11/13/2019, 2:22 PM  Cc: Lewis Moccasin, MD  Total time on date of encounter was 45  minutes (this included time spent preparing to see the patient reviewing records; obtaining and/or reviewing separately obtained history; performing a medically appropriate exam and/or evaluation; counseling and educating the patient and family if present; ordering medications, tests or procedures if applicable; and documenting clinical information in the health record).

## 2019-11-17 ENCOUNTER — Encounter: Payer: Self-pay | Admitting: Dermatology

## 2019-11-17 NOTE — Progress Notes (Signed)
   Follow-Up Visit   Subjective  Monica Jackson is a 29 y.o. female who presents for the following: Follow-up (check up).  Acne Location:  Duration:  Quality: Clear Associated Signs/Symptoms: Modifying Factors:  Severity:  Timing: Context:   Objective  Well appearing patient in no apparent distress; mood and affect are within normal limits.  A focused examination was performed including Head, neck, upper torso.. Relevant physical exam findings are noted in the Assessment and Plan.   Assessment & Plan    Acne vulgaris (2) Head - Anterior (Face)  2 months at 40 mg isotretinoin daily to reach [place] target dose.  Continued compliance with I pledge.  Pregnancy avoidance.  Sun protection.  Other Related Procedures Pregnancy, urine     I, Janalyn Harder, MD, have reviewed all documentation for this visit.  The documentation on 11/17/19 for the exam, diagnosis, procedures, and orders are all accurate and complete.

## 2019-11-25 ENCOUNTER — Encounter: Payer: Self-pay | Admitting: Gastroenterology

## 2019-12-01 ENCOUNTER — Encounter: Payer: Self-pay | Admitting: Dermatology

## 2019-12-01 NOTE — Progress Notes (Signed)
   Follow-Up Visit   Subjective  Monica Jackson is a 29 y.o. female who presents for the following: Follow-up (skin is better, acne are gone).  acne Location:  Duration:  Quality:  Associated Signs/Symptoms: Modifying Factors:  Severity:  Timing: Context:   Objective  Well appearing patient in no apparent distress; mood and affect are within normal limits.  A focused examination was performed including Head, neck, upper torso. Relevant physical exam findings are noted in the Assessment and Plan.   Assessment & Plan    Acne vulgaris (2) Head - Anterior (Face)  Will reach [place] target dose in 2 months; decide at that time whether to continue.  Continue I pledge compliance.  Sun protection.  Other Related Procedures Pregnancy, urine     I, Janalyn Harder, MD, have reviewed all documentation for this visit.  The documentation on 12/01/19 for the exam, diagnosis, procedures, and orders are all accurate and complete.

## 2019-12-02 ENCOUNTER — Other Ambulatory Visit: Payer: Self-pay | Admitting: Gastroenterology

## 2019-12-02 ENCOUNTER — Ambulatory Visit (INDEPENDENT_AMBULATORY_CARE_PROVIDER_SITE_OTHER): Payer: BC Managed Care – PPO

## 2019-12-02 DIAGNOSIS — Z1159 Encounter for screening for other viral diseases: Secondary | ICD-10-CM | POA: Diagnosis not present

## 2019-12-03 LAB — SARS CORONAVIRUS 2 (TAT 6-24 HRS): SARS Coronavirus 2: NEGATIVE

## 2019-12-04 ENCOUNTER — Other Ambulatory Visit: Payer: Self-pay

## 2019-12-04 ENCOUNTER — Ambulatory Visit (AMBULATORY_SURGERY_CENTER): Payer: BC Managed Care – PPO | Admitting: Gastroenterology

## 2019-12-04 ENCOUNTER — Encounter: Payer: Self-pay | Admitting: Gastroenterology

## 2019-12-04 VITALS — BP 109/69 | HR 86 | Temp 96.9°F | Resp 10 | Ht 68.0 in | Wt 191.0 lb

## 2019-12-04 DIAGNOSIS — R194 Change in bowel habit: Secondary | ICD-10-CM | POA: Diagnosis not present

## 2019-12-04 DIAGNOSIS — K5900 Constipation, unspecified: Secondary | ICD-10-CM

## 2019-12-04 MED ORDER — SODIUM CHLORIDE 0.9 % IV SOLN: 500.0000 mL | Freq: Once | INTRAVENOUS | Status: DC

## 2019-12-04 NOTE — Progress Notes (Signed)
VS- Jacqulynn Cadet RN  Pt had a seizure in 2015- none since.  She states, "I came off of my meds cold [place] and had a seizure."  None since then

## 2019-12-04 NOTE — Patient Instructions (Signed)
Start back on the fiber supplement. Take everyday for at least one month. Call my office to discuss how your bowel habits are feeling after the one month.   If you desire to have the external "skin tag" removed, contact [facility] for a consultation.   YOU HAD AN ENDOSCOPIC PROCEDURE TODAY AT THE [facility]:   Refer to the procedure report that was given to you for any specific questions about what was found during the examination.  If the procedure report does not answer your questions, please call your gastroenterologist to clarify.  If you requested that your care partner not be given the details of your procedure findings, then the procedure report has been included in a sealed envelope for you to review at your convenience later.  YOU SHOULD EXPECT: Some feelings of bloating in the abdomen. Passage of more gas than usual.  Walking can help get rid of the air that was put into your GI tract during the procedure and reduce the bloating. If you had a lower endoscopy (such as a colonoscopy or flexible sigmoidoscopy) you may notice spotting of blood in your stool or on the toilet paper. If you underwent a bowel prep for your procedure, you may not have a normal bowel movement for a few days.  Please Note:  You might notice some irritation and congestion in your nose or some drainage.  This is from the oxygen used during your procedure.  There is no need for concern and it should clear up in a day or so.  SYMPTOMS TO REPORT IMMEDIATELY:   Following lower endoscopy (colonoscopy or flexible sigmoidoscopy):  Excessive amounts of blood in the stool  Significant tenderness or worsening of abdominal pains  Swelling of the abdomen that is new, acute  Fever of 100F or higher  For urgent or emergent issues, a gastroenterologist can be reached at any hour by calling [phone]. Do not use MyChart messaging for urgent concerns.    DIET:  We do recommend a small meal at  first, but then you may proceed to your regular diet.  Drink plenty of fluids but you should avoid alcoholic beverages for 24 hours.  ACTIVITY:  You should plan to take it easy for the rest of today and you should NOT DRIVE or use heavy machinery until tomorrow (because of the sedation medicines used during the test).    FOLLOW UP: Our staff will call the number listed on your records 48-72 hours following your procedure to check on you and address any questions or concerns that you may have regarding the information given to you following your procedure. If we do not reach you, we will leave a message.  We will attempt to reach you two times.  During this call, we will ask if you have developed any symptoms of COVID 19. If you develop any symptoms (ie: fever, flu-like symptoms, shortness of breath, cough etc.) before then, please call [phone].  If you test positive for Covid 19 in the 2 weeks post procedure, please call and report this information to [place].    If any biopsies were taken you will be contacted by phone or by letter within the next 1-3 weeks.  Please call us at [phone] if you have not heard about the biopsies in 3 weeks.    SIGNATURES/CONFIDENTIALITY: You and/or your care partner have signed paperwork which will be entered into your electronic medical record.  These signatures attest to the fact that that  the information above on your After Visit Summary has been reviewed and is understood.  Full responsibility of the confidentiality of this discharge information lies with you and/or your care-partner.

## 2019-12-04 NOTE — Op Note (Signed)
[facility] Patient Name: Monica Jackson Procedure Date: 12/04/2019 8:31 AM MRN: [id number] Endoscopist: Rachael Fee , MD Age: 29 Referring MD:  Date of Birth: 07/16/1990 Gender: Female Account #: [id number] Procedure:                Colonoscopy Indications:              Change in bowel habits, minor rectal bleeding Medicines:                Monitored Anesthesia Care Procedure:                Pre-Anesthesia Assessment:                           - Prior to the procedure, a History and Physical                            was performed, and patient medications and                            allergies were reviewed. The patient's tolerance of                            previous anesthesia was also reviewed. The risks                            and benefits of the procedure and the sedation                            options and risks were discussed with the patient.                            All questions were answered, and informed consent                            was obtained. Prior Anticoagulants: The patient has                            taken no previous anticoagulant or antiplatelet                            agents. ASA Grade Assessment: II - A patient with                            mild systemic disease. After reviewing the risks                            and benefits, the patient was deemed in                            satisfactory condition to undergo the procedure.                           After obtaining informed consent, the colonoscope  was passed under direct vision. Throughout the                            procedure, the patient's blood pressure, pulse, and                            oxygen saturations were monitored continuously. The                            Colonoscope was introduced through the anus and                            advanced to the the terminal ileum. The colonoscopy                            was  performed without difficulty. The patient                            tolerated the procedure well. The quality of the                            bowel preparation was good. Scope In: 8:34:46 AM Scope Out: 8:47:11 AM Scope Withdrawal Time: 0 hours 8 minutes 14 seconds  Total Procedure Duration: 0 hours 12 minutes 25 seconds  Findings:                 The terminal ileum appeared normal.                           The entire examined colon appeared normal on direct                            and retroflexion views. Complications:            No immediate complications. Estimated blood loss:                            None. Estimated Blood Loss:     Estimated blood loss: none. Impression:               - The examined portion of the ileum was normal.                           - The entire examined colon is normal on direct and                            retroflexion views.                           - No polyps, cancers, inflammation or infection. Recommendation:           - Patient has a contact number available for                            emergencies. The signs and symptoms of potential  delayed complications were discussed with the                            patient. Return to normal activities tomorrow.                            Written discharge instructions were provided to the                            patient.                           - Resume previous diet.                           - Continue present medications.                           - Repeat colonoscopy at age 36 for screening. Rachael Fee, MD 12/04/2019 8:52:00 AM This report has been signed electronically.

## 2019-12-04 NOTE — Progress Notes (Signed)
PT taken to PACU. Monitors in place. VSS. Report given to RN. 

## 2019-12-06 ENCOUNTER — Telehealth: Payer: Self-pay | Admitting: *Deleted

## 2019-12-06 NOTE — Telephone Encounter (Signed)
Left message on f/u call 

## 2019-12-06 NOTE — Telephone Encounter (Signed)
No answer for post procedure call back. Left message for patient to call with questions or concerns. 

## 2019-12-18 ENCOUNTER — Other Ambulatory Visit: Payer: Self-pay

## 2019-12-18 ENCOUNTER — Ambulatory Visit: Payer: BC Managed Care – PPO | Admitting: Dermatology

## 2019-12-18 ENCOUNTER — Encounter: Payer: Self-pay | Admitting: Dermatology

## 2019-12-18 VITALS — Wt 191.0 lb

## 2019-12-18 DIAGNOSIS — L700 Acne vulgaris: Secondary | ICD-10-CM | POA: Diagnosis not present

## 2019-12-19 LAB — PREGNANCY, URINE: Preg Test, Ur: NEGATIVE

## 2019-12-24 ENCOUNTER — Other Ambulatory Visit: Payer: Self-pay | Admitting: Dermatology

## 2019-12-24 DIAGNOSIS — L700 Acne vulgaris: Secondary | ICD-10-CM

## 2020-01-20 ENCOUNTER — Ambulatory Visit: Payer: BC Managed Care – PPO | Admitting: Dermatology

## 2020-01-20 ENCOUNTER — Telehealth: Payer: Self-pay | Admitting: Dermatology

## 2020-01-20 NOTE — Telephone Encounter (Signed)
Unhappy. Said she went to pick up Rx on 7th day and it hadn't been called in----said this has happened before--- so she has been kicked out of the ipledge program. Cancelled today's appt.

## 2020-01-20 NOTE — Telephone Encounter (Signed)
Called and spoke with patient- I was very apologetic about the prescription. Patient stated this was just a FYI  And that she had completed  Therapy.  Told patient to call us if needed.

## 2020-01-25 ENCOUNTER — Encounter: Payer: Self-pay | Admitting: Dermatology

## 2020-01-25 NOTE — Progress Notes (Signed)
   Follow-Up Visit   Subjective  Monica Jackson is a 29 y.o. female who presents for the following: No chief complaint on file..  Acne Location:  Duration:  Quality:  Associated Signs/Symptoms: Modifying Factors:  Severity:  Timing: Context:   Objective  Well appearing patient in no apparent distress; mood and affect are within normal limits.  A focused examination was performed including Head and neck.. Relevant physical exam findings are noted in the Assessment and Plan.   Assessment & Plan    Acne vulgaris (2) Head - Anterior (Face)  Final pregnancy test, call if there is any flare.  Other Related Procedures Pregnancy, urine     I, Janalyn Harder, MD, have reviewed all documentation for this visit.  The documentation on 01/25/20 for the exam, diagnosis, procedures, and orders are all accurate and complete.

## 2020-03-16 ENCOUNTER — Ambulatory Visit: Payer: BC Managed Care – PPO | Admitting: Family Medicine

## 2020-03-17 ENCOUNTER — Telehealth: Payer: Self-pay

## 2020-03-17 NOTE — Telephone Encounter (Signed)
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 Please advise if she can be worked in sooner, thank you !

## 2020-03-17 NOTE — Telephone Encounter (Signed)
Error

## 2020-03-17 NOTE — Telephone Encounter (Signed)
FYI...Please Advise  °

## 2020-03-18 NOTE — Telephone Encounter (Signed)
LVM TO SCHEDULE APPT

## 2020-03-18 NOTE — Telephone Encounter (Signed)
You can work her in next week or after.  Dr Artis Flock

## 2020-03-28 DIAGNOSIS — F411 Generalized anxiety disorder: Secondary | ICD-10-CM | POA: Diagnosis not present

## 2020-03-28 DIAGNOSIS — F3341 Major depressive disorder, recurrent, in partial remission: Secondary | ICD-10-CM | POA: Diagnosis not present

## 2020-03-28 DIAGNOSIS — F4312 Post-traumatic stress disorder, chronic: Secondary | ICD-10-CM | POA: Diagnosis not present

## 2020-04-02 DIAGNOSIS — R300 Dysuria: Secondary | ICD-10-CM | POA: Diagnosis not present

## 2020-04-02 DIAGNOSIS — N926 Irregular menstruation, unspecified: Secondary | ICD-10-CM | POA: Diagnosis not present

## 2020-04-02 DIAGNOSIS — Z113 Encounter for screening for infections with a predominantly sexual mode of transmission: Secondary | ICD-10-CM | POA: Diagnosis not present

## 2020-04-02 DIAGNOSIS — R309 Painful micturition, unspecified: Secondary | ICD-10-CM | POA: Diagnosis not present

## 2020-04-02 DIAGNOSIS — N939 Abnormal uterine and vaginal bleeding, unspecified: Secondary | ICD-10-CM | POA: Diagnosis not present

## 2020-04-02 DIAGNOSIS — N760 Acute vaginitis: Secondary | ICD-10-CM | POA: Diagnosis not present

## 2020-05-08 ENCOUNTER — Ambulatory Visit: Payer: BC Managed Care – PPO | Admitting: Family Medicine

## 2020-05-08 DIAGNOSIS — Z0289 Encounter for other administrative examinations: Secondary | ICD-10-CM

## 2020-05-13 ENCOUNTER — Encounter: Payer: Self-pay | Admitting: Family Medicine

## 2020-05-13 ENCOUNTER — Other Ambulatory Visit: Payer: Self-pay

## 2020-05-13 ENCOUNTER — Ambulatory Visit: Payer: BC Managed Care – PPO | Admitting: Family Medicine

## 2020-05-13 DIAGNOSIS — M6289 Other specified disorders of muscle: Secondary | ICD-10-CM

## 2020-05-13 DIAGNOSIS — K588 Other irritable bowel syndrome: Secondary | ICD-10-CM

## 2020-05-13 NOTE — Patient Instructions (Signed)
Nice to meet you! Will send in medicine for bladder when you need it.   Come back whenever in 6-8 months for annual and we will do fasting lab!    So nice to meet you!  Dr. Artis Flock

## 2020-05-13 NOTE — Progress Notes (Signed)
Patient: Monica Jackson MRN: [id number] DOB: 1991-01-02 PCP: Orland Mustard, MD     Subjective:  Chief Complaint  Patient presents with  . Establish Care    HPI: The patient is a 30 y.o. female who presents today for establishing care. She has an extensive medical history for her age. Her main thing to discuss today is her pelvic floor dysfunction. She has seen urology and done pelvic floor therapy. She is on medication prn for this that is her saving grace. She is trying to find the names of her medication she uses when she has burning.   Hx of depression/anxiety/PTSD and panic attack. [facility], only seen a PA. currently on prazosin, pristiq, trazodone and klonpin BID. They prescribe this medication.   Hx of abnormal pap smears -leep done and has had normal pap smears since that time. Last pap smear done in 05/2019. STD testing done last month.    Review of Systems  Constitutional: Negative for chills, fatigue and fever.  HENT: Negative for dental problem, ear pain, hearing loss and trouble swallowing.   Eyes: Negative for visual disturbance.  Respiratory: Negative for cough, chest tightness and shortness of breath.   Cardiovascular: Negative for chest pain, palpitations and leg swelling.  Gastrointestinal: Negative for abdominal pain, blood in stool, diarrhea and nausea.  Endocrine: Negative for cold intolerance, polydipsia, polyphagia and polyuria.  Genitourinary: Negative for dysuria and hematuria.  Musculoskeletal: Negative for arthralgias.  Skin: Negative for rash.  Neurological: Negative for dizziness and headaches.  Psychiatric/Behavioral: Negative for dysphoric mood and sleep disturbance. The patient is not nervous/anxious.     Allergies Patient is allergic to sulfa antibiotics.  Past Medical History Patient  has a past medical history of Allergy, Anxiety, Depression, GERD (gastroesophageal reflux disease), Glaucoma, H/O bladder infections, IBS  (irritable bowel syndrome), Seizures (HCC), and Substance abuse (HCC).  Surgical History Patient  has a past surgical history that includes Cosmetic surgery and LEEP (N/A, 08/27/2013).  Family History Pateint's family history includes Cancer in her paternal grandmother; Colon polyps in her paternal grandfather; Diabetes in her maternal grandfather, maternal grandmother, and mother; Heart disease in her maternal grandfather; Hyperlipidemia in her father; Hypertension in her paternal grandfather.  Social History Patient  reports that she has quit smoking. She has never used smokeless tobacco. She reports current alcohol use. She reports previous drug use.    Objective: Vitals:   05/13/20 1135  BP: 104/70  Pulse: 87  Temp: 98.1 F (36.7 C)  TempSrc: Temporal  SpO2: 100%  Weight: 176 lb 12.8 oz (80.2 kg)  Height: 5\' 8"  (1.727 m)    Body mass index is 26.88 kg/m.  Physical Exam Vitals reviewed.  Constitutional:      Appearance: Normal appearance. She is well-developed, normal weight and well-nourished.  HENT:     Head: Normocephalic and atraumatic.     Right Ear: Tympanic membrane, ear canal and external ear normal.     Left Ear: Tympanic membrane, ear canal and external ear normal.     Mouth/Throat:     Mouth: Oropharynx is clear and moist.  Eyes:     Extraocular Movements: Extraocular movements intact and EOM normal.     Conjunctiva/sclera: Conjunctivae normal.     Pupils: Pupils are equal, round, and reactive to light.  Neck:     Thyroid: No thyromegaly.  Cardiovascular:     Rate and Rhythm: Normal rate and regular rhythm.     Pulses: Normal pulses and intact distal pulses.  Heart sounds: Normal heart sounds. No murmur heard.   Pulmonary:     Effort: Pulmonary effort is normal.     Breath sounds: Normal breath sounds.  Abdominal:     General: Abdomen is flat. Bowel sounds are normal. There is no distension.     Palpations: Abdomen is soft.     Tenderness: There  is no abdominal tenderness.  Musculoskeletal:     Cervical back: Normal range of motion and neck supple.  Lymphadenopathy:     Cervical: No cervical adenopathy.  Skin:    General: Skin is warm and dry.     Capillary Refill: Capillary refill takes less than 2 seconds.     Findings: No rash.  Neurological:     General: No focal deficit present.     Mental Status: She is alert and oriented to person, place, and time.     Cranial Nerves: No cranial nerve deficit.     Coordination: Coordination normal.     Deep Tendon Reflexes: Reflexes normal.  Psychiatric:        Mood and Affect: Mood and affect and mood normal.        Behavior: Behavior normal.        Assessment/plan: 1. Pelvic floor dysfunction Seen by PT/specialists. Sounds like takes hycosamine which I can give her. No refills needed at this time.   2. Other irritable bowel syndrome  Will come back for annual when fasting.   This visit occurred during the SARS-CoV-2 public health emergency.  Safety protocols were in place, including screening questions prior to the visit, additional usage of staff PPE, and extensive cleaning of exam room while observing appropriate contact time as indicated for disinfecting solutions.    Total time of encounter: 40 minutes total time of encounter, including 35 minutes spent in face-to-face patient care. This time includes coordination of care and counseling regarding medical history, meds, specialists, HM. Remainder of non-face-to-face time involved reviewing chart documents/testing relevant to the patient encounter and documentation in the medical record.  Return in about 6 months (around 11/13/2020) for annual with fasting labs .Marland Kitchen     Orland Mustard, MD [facility]  05/13/2020

## 2020-05-16 DIAGNOSIS — F411 Generalized anxiety disorder: Secondary | ICD-10-CM | POA: Diagnosis not present

## 2020-05-16 DIAGNOSIS — F331 Major depressive disorder, recurrent, moderate: Secondary | ICD-10-CM | POA: Diagnosis not present

## 2020-05-16 DIAGNOSIS — F4312 Post-traumatic stress disorder, chronic: Secondary | ICD-10-CM | POA: Diagnosis not present

## 2020-06-11 DIAGNOSIS — F331 Major depressive disorder, recurrent, moderate: Secondary | ICD-10-CM | POA: Diagnosis not present

## 2020-06-23 DIAGNOSIS — F331 Major depressive disorder, recurrent, moderate: Secondary | ICD-10-CM | POA: Diagnosis not present

## 2020-06-26 DIAGNOSIS — F4312 Post-traumatic stress disorder, chronic: Secondary | ICD-10-CM | POA: Diagnosis not present

## 2020-06-26 DIAGNOSIS — F3341 Major depressive disorder, recurrent, in partial remission: Secondary | ICD-10-CM | POA: Diagnosis not present

## 2020-06-26 DIAGNOSIS — F411 Generalized anxiety disorder: Secondary | ICD-10-CM | POA: Diagnosis not present

## 2020-07-02 DIAGNOSIS — F331 Major depressive disorder, recurrent, moderate: Secondary | ICD-10-CM | POA: Diagnosis not present

## 2020-07-17 DIAGNOSIS — F3341 Major depressive disorder, recurrent, in partial remission: Secondary | ICD-10-CM | POA: Diagnosis not present

## 2020-07-17 DIAGNOSIS — F331 Major depressive disorder, recurrent, moderate: Secondary | ICD-10-CM | POA: Diagnosis not present

## 2020-07-17 DIAGNOSIS — F4312 Post-traumatic stress disorder, chronic: Secondary | ICD-10-CM | POA: Diagnosis not present

## 2020-07-17 DIAGNOSIS — F411 Generalized anxiety disorder: Secondary | ICD-10-CM | POA: Diagnosis not present

## 2020-07-20 ENCOUNTER — Other Ambulatory Visit: Payer: Self-pay

## 2020-07-20 ENCOUNTER — Ambulatory Visit: Payer: BC Managed Care – PPO | Admitting: Family Medicine

## 2020-07-20 ENCOUNTER — Encounter: Payer: Self-pay | Admitting: Family Medicine

## 2020-07-20 VITALS — BP 108/60 | HR 81 | Temp 97.6°F | Ht 68.0 in | Wt 170.2 lb

## 2020-07-20 DIAGNOSIS — Z020 Encounter for examination for admission to educational institution: Secondary | ICD-10-CM

## 2020-07-20 NOTE — Progress Notes (Signed)
Patient: Monica Jackson MRN: [id number] DOB: 05-03-1990 PCP: Orland Mustard, MD     Subjective:  Chief Complaint  Patient presents with  . school admittance    HPI: The patient is a 30 y.o. 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 She has all of her requirements and her immunization record with her.  UTD on all shots but flu and covid. She also needs TB testing. Will get her flu shot done today.  We do not have covid shots in the clinic.    Review of Systems  Constitutional: Negative for chills, fatigue and fever.  HENT: Negative for dental problem, ear pain, hearing loss and trouble swallowing.   Eyes: Negative for visual disturbance.  Respiratory: Negative for cough, chest tightness and shortness of breath.   Cardiovascular: Negative for chest pain, palpitations and leg swelling.  Gastrointestinal: Negative for abdominal pain, blood in stool, diarrhea and nausea.  Endocrine: Negative for cold intolerance, polydipsia, polyphagia and polyuria.  Genitourinary: Negative for dysuria and hematuria.  Musculoskeletal: Negative for arthralgias.  Skin: Negative for rash.  Neurological: Negative for dizziness and headaches.  Psychiatric/Behavioral: Negative for dysphoric mood and sleep disturbance. The patient is not nervous/anxious.   i  Allergies Patient is allergic to sulfa antibiotics.  Past Medical History Patient  has a past medical history of Allergy, Anxiety, Depression, GERD (gastroesophageal reflux disease), Glaucoma, H/O bladder infections, IBS (irritable bowel syndrome), Seizures (HCC), and Substance abuse (HCC).  Surgical History Patient  has a past surgical history that includes Cosmetic surgery and LEEP (N/A, 08/27/2013).  Family History Pateint's family history includes Cancer in her paternal grandmother; Colon polyps in her paternal grandfather; Diabetes in her maternal grandfather, maternal grandmother, and mother; Heart disease in her  maternal grandfather; Hyperlipidemia in her father; Hypertension in her paternal grandfather.  Social History Patient  reports that she has quit smoking. She has never used smokeless tobacco. She reports current alcohol use. She reports previous drug use.    Objective: Vitals:   07/20/20 1321  BP: 108/60  Pulse: 81  Temp: 97.6 F (36.4 C)  SpO2: 98%  Weight: 170 lb 3.2 oz (77.2 kg)  Height: 5\' 8"  (1.727 m)    Body mass index is 25.88 kg/m.  Physical Exam Vitals reviewed.  Constitutional:      Appearance: Normal appearance. She is normal weight.  HENT:     Head: Normocephalic and atraumatic.  Cardiovascular:     Rate and Rhythm: Normal rate and regular rhythm.     Heart sounds: Normal heart sounds.  Pulmonary:     Effort: Pulmonary effort is normal.     Breath sounds: Normal breath sounds.  Abdominal:     General: Bowel sounds are normal.     Palpations: Abdomen is soft.  Skin:    General: Skin is warm.     Capillary Refill: Capillary refill takes less than 2 seconds.  Neurological:     General: No focal deficit present.     Mental Status: She is alert and oriented to person, place, and time.  Psychiatric:        Mood and Affect: Mood normal.        Behavior: Behavior normal.        Assessment/plan: 1. School health examination TB test and flu shot. Will have to go elsewhere for covid vaccines. Discussed local pharmacies would be easiest. She is otherwise UTD. Very excited for her. We are out of flu shot, she will just try the pharmacy.  -  QuantiFERON-TB Gold Plus    Return in about 6 months (around 01/20/2021) for annual/toc with andy .     Orland Mustard, MD [facility]  07/20/2020

## 2020-07-22 LAB — QUANTIFERON-TB GOLD PLUS
Mitogen-NIL: >10 IU/mL
NIL: 0.04 IU/mL
QuantiFERON-TB Gold Plus: NEGATIVE
TB1-NIL: 0 IU/mL
TB2-NIL: 0 IU/mL

## 2020-07-30 DIAGNOSIS — F331 Major depressive disorder, recurrent, moderate: Secondary | ICD-10-CM | POA: Diagnosis not present

## 2020-08-19 ENCOUNTER — Ambulatory Visit: Payer: BC Managed Care – PPO | Admitting: Family Medicine

## 2020-10-01 DIAGNOSIS — Z20822 Contact with and (suspected) exposure to covid-19: Secondary | ICD-10-CM | POA: Diagnosis not present

## 2020-10-14 DIAGNOSIS — F331 Major depressive disorder, recurrent, moderate: Secondary | ICD-10-CM | POA: Diagnosis not present

## 2020-10-23 DIAGNOSIS — F331 Major depressive disorder, recurrent, moderate: Secondary | ICD-10-CM | POA: Diagnosis not present

## 2020-12-22 ENCOUNTER — Ambulatory Visit: Payer: BC Managed Care – PPO | Admitting: Physician Assistant

## 2021-01-12 ENCOUNTER — Other Ambulatory Visit (INDEPENDENT_AMBULATORY_CARE_PROVIDER_SITE_OTHER): Payer: BC Managed Care – PPO

## 2021-01-12 ENCOUNTER — Encounter: Payer: Self-pay | Admitting: Nurse Practitioner

## 2021-01-12 ENCOUNTER — Ambulatory Visit: Payer: BC Managed Care – PPO | Admitting: Nurse Practitioner

## 2021-01-12 VITALS — BP 90/66 | HR 85 | Ht 68.0 in | Wt 165.0 lb

## 2021-01-12 DIAGNOSIS — K219 Gastro-esophageal reflux disease without esophagitis: Secondary | ICD-10-CM | POA: Diagnosis not present

## 2021-01-12 DIAGNOSIS — R112 Nausea with vomiting, unspecified: Secondary | ICD-10-CM

## 2021-01-12 DIAGNOSIS — K5900 Constipation, unspecified: Secondary | ICD-10-CM

## 2021-01-12 LAB — COMPREHENSIVE METABOLIC PANEL
ALT: 8 U/L (ref 0–35)
AST: 12 U/L (ref 0–37)
Albumin: 4.8 g/dL (ref 3.5–5.2)
Alkaline Phosphatase: 51 U/L (ref 39–117)
BUN: 12 mg/dL (ref 6–23)
CO2: 23 mEq/L (ref 19–32)
Calcium: 9.4 mg/dL (ref 8.4–10.5)
Chloride: 108 mEq/L (ref 96–112)
Creatinine, Ser: 0.88 mg/dL (ref 0.40–1.20)
GFR: 89.67 mL/min (ref >60.00)
Glucose, Bld: 100 mg/dL — ABNORMAL HIGH (ref 70–99)
Potassium: 3.7 mEq/L (ref 3.5–5.1)
Sodium: 141 mEq/L (ref 135–145)
Total Bilirubin: 0.6 mg/dL (ref 0.2–1.2)
Total Protein: 7.6 g/dL (ref 6.0–8.3)

## 2021-01-12 LAB — CBC WITH DIFFERENTIAL/PLATELET
Basophils Absolute: 0 10*3/uL (ref 0.0–0.1)
Basophils Relative: 0.4 % (ref 0.0–3.0)
Eosinophils Absolute: 0 10*3/uL (ref 0.0–0.7)
Eosinophils Relative: 0.2 % (ref 0.0–5.0)
HCT: 41.1 % (ref 36.0–46.0)
Hemoglobin: 13.6 g/dL (ref 12.0–15.0)
Lymphocytes Relative: 26.2 % (ref 12.0–46.0)
Lymphs Abs: 1.6 10*3/uL (ref 0.7–4.0)
MCHC: 33.2 g/dL (ref 30.0–36.0)
MCV: 93.9 fl (ref 78.0–100.0)
Monocytes Absolute: 0.4 10*3/uL (ref 0.1–1.0)
Monocytes Relative: 6 % (ref 3.0–12.0)
Neutro Abs: 4.2 10*3/uL (ref 1.4–7.7)
Neutrophils Relative %: 67.2 % (ref 43.0–77.0)
Platelets: 286 10*3/uL (ref 150.0–400.0)
RBC: 4.37 Mil/uL (ref 3.87–5.11)
RDW: 12.6 % (ref 11.5–15.5)
WBC: 6.3 10*3/uL (ref 4.0–10.5)

## 2021-01-12 MED ORDER — OMEPRAZOLE 20 MG PO CPDR: DELAYED_RELEASE_CAPSULE | ORAL | 1 refills | Status: DC

## 2021-01-12 MED ORDER — ONDANSETRON HCL 4 MG PO TABS: 4.0000 mg | ORAL_TABLET | Freq: Three times a day (TID) | ORAL | 0 refills | Status: AC | PRN

## 2021-01-12 NOTE — Progress Notes (Signed)
01/12/2021 AYSE MCCARTIN [id number] 1990-09-24   Chief Complaint: Nausea/vomiting  History of Present Illness: Monica Jackson. Monica Jackson is a 30 year old female with a history of anxiety, migraine headaches, alternating constipation, diarrhea and rectal bleeding followed by Dr. Christella Hartigan. She presents to our office today for further evaluation regarding intermittent nausea and vomiting. She is accompanied by her mother. She  endorses having intermittent nausea since April 2022 and vomits partially digested food or water. Approximately 3 weeks ago, she ate a [place] steak sandwich for dinner and vomited it up the next morning along with coffee ground emesis. No frank red hematemesis. She gags easily. She frequently skips breakfast. She eats one large meal at lunch time and often eats a small dinner later in the evening. She has more nausea when constipated. She stated her stools vary in shape, size and color. Stools are mushy to "twig like with berries".  She sometimes goes a few days without passing a BM, then passes a hard stool followed by nonbloody diarrhea. She can go 7 to 10 days without a BM. She underwent a colonoscopy 12/04/2019 by Dr.  Christella Hartigan due to having alternating diarrhea/constipation and rectal bleeding which was normal. She complains of increased hiccups. She has increased heartburn for which she started taking OTC Omeprazole 20mg  QD 1 or 2 weeks ago. She sometimes coughs after eating, no specific food triggers. She also noted having increased post nasal drainage and started taking Zyrtec. She reported losing 35lbs since April 2022. Epic records showed 11lb weight loss since 05/2020. She is afraid to eat. No marijuana use. No fevers, sweats or chills. She and her mother are christian 06/2020, travel frequently for music tours. She has anxiety which she reported was stable, followed by a psychiatrist and therapist.   Current Outpatient Medications on File Prior to Visit  Medication Sig  Dispense Refill   clonazePAM (KLONOPIN) 0.5 MG tablet Take 0.5 mg by mouth 2 (two) times daily.     desvenlafaxine (PRISTIQ) 100 MG 24 hr tablet Take by mouth.      docusate sodium (COLACE) 100 MG capsule Take 100 mg by mouth daily.     fluticasone (FLONASE) 50 MCG/ACT nasal spray 2 sprays by Each Nare route daily.     levonorgestrel (MIRENA) 20 MCG/24HR IUD 1 each by Intrauterine route once.     prazosin (MINIPRESS) 1 MG capsule TAKE 1 CAPSULE BY MOUTH EVERYDAY AT BEDTIME     SUMAtriptan (IMITREX) 50 MG tablet Take 1 tablet (50 mg total) by mouth every 2 (two) hours as needed for migraine. Take 1 tablet at onset of headache, may repeat in 2 hours 10 tablet 6   topiramate (TOPAMAX) 100 MG tablet TAKE 1 TABLET BY MOUTH TWICE A DAY 180 tablet 0   traZODone (DESYREL) 150 MG tablet Take 150 mg by mouth at bedtime.     No current facility-administered medications on file prior to visit.   Allergies  Allergen Reactions   Sulfa Antibiotics Nausea And Vomiting and Other (See Comments)    Crazy feeling    Current Medications, Allergies, Past Medical History, Past Surgical History, Family History and Social History were reviewed in Higher education careers adviser record.   Review of Systems:   Constitutional: Negative for fever, sweats, chills or weight loss.  Respiratory: Negative for shortness of breath.   Cardiovascular: Negative for chest pain, palpitations and leg swelling.  Gastrointestinal: See HPI.  Musculoskeletal: Negative for back pain or muscle aches.  Neurological:  Negative for dizziness, headaches or paresthesias.   Physical Exam: BP 90/66   Pulse 85   Ht 5\' 8"  (1.727 m)   Wt 165 lb (74.8 kg)   BMI 25.09 kg/m  IUD in place.  Wt Readings from Last 3 Encounters:  01/12/21 165 lb (74.8 kg)  07/20/20 170 lb 3.2 oz (77.2 kg)  05/13/20 176 lb 12.8 oz (80.2 kg)    General: 30 year old female in no acute distress. Head: Normocephalic and atraumatic. Eyes: No scleral  icterus. Conjunctiva pink . Ears: Normal auditory acuity. Mouth: Dentition intact. No ulcers or lesions.  Lungs: Clear throughout to auscultation. Heart: Regular rate and rhythm, no murmur. Abdomen: Soft, nontender and nondistended. No masses or hepatomegaly. Normal bowel sounds x 4 quadrants.  Rectal: Deferred.  Musculoskeletal: Symmetrical with no gross deformities. Extremities: No edema. Neurological: Alert oriented x 4. No focal deficits.  Psychological: Alert and cooperative. Normal mood and affect  Assessment and Recommendations:  47) 30 year old female with N/V. Reported coffee ground emesis x 1 three weeks ago.  -EGD benefits and risks discussed including risk with sedation, risk of bleeding, perforation and infection  -Continue Omeprazole 20mg  QD -Recommended 3 to 4 small meals daily, avoid eating late at night  -CBC, CMP -Ondansetron 4mg  one po Q 6 hrs PRN N/V -Patient to call office if symptoms worsen   2) GERD symptoms  -See plan in # 1  3) IBS, Constipation > diarrhea  -Miralax Q HS as tolerated  -Probiotic of choice QD -TTG, IGA level   4) Anxiety, stable -Continue follow up with psychiatrist and therapist

## 2021-01-12 NOTE — Patient Instructions (Addendum)
LABS:  Lab work has been ordered for you today. Our lab is located in the basement. Press "B" on the elevator. The lab is located at the first door on the left as you exit the elevator.  HEALTHCARE LAWS AND MY CHART RESULTS: Due to recent changes in healthcare laws, you may see the results of your imaging and laboratory studies on MyChart before your provider has had a chance to review them.   We understand that in some cases there may be results that are confusing or concerning to you. Not all laboratory results come back in the same time frame and the provider may be waiting for multiple results in order to interpret others.  Please give [place] 48 hours in order for your provider to thoroughly review all the results before contacting the office for clarification of your results.   MEDICATION: We have sent the following medication to your pharmacy for you to pick up at your convenience: Omeprazole 20 MG tablet 30 minutes before breakfast daily. Ondansetron (ZOFRAN ODT) 4 MG tablet- take 1 tablet every 8 hours as needed for nausea or vomiting.  OVER THE COUNTER MEDICATION Please purchase the following medications over the counter and take as directed:  Miralax- Dissolve one capful in 8 ounces of water and drink before bed as tolerated. Take a probiotic of your choice once a day.  PROCEDURES: You have been scheduled for a EGD. Please follow the written instructions given to you at your visit today. If you use inhalers (even only as needed), please bring them with you on the day of your procedure.  It was great seeing you today! Thank you for entrusting me with your care and choosing [facility]. Arnaldo Natal, CRNP  The [facility] providers would like to encourage you to use [facility] to communicate with providers for non-urgent requests or questions.  Due to long hold times on the telephone, sending your provider a message by [facility] may be faster and more efficient way to get  a response. Please allow 48 business hours for a response.  Please remember that this is for non-urgent requests/questions.  If you are age 28 or older, your body mass index should be between 23-30. Your Body mass index is 25.09 kg/m. If this is out of the aforementioned range listed, please consider follow up with your Primary Care Provider.  If you are age 28 or younger, your body mass index should be between 19-25. Your Body mass index is 25.09 kg/m. If this is out of the aformentioned range listed, please consider follow up with your Primary Care Provider.

## 2021-01-13 ENCOUNTER — Encounter: Payer: Self-pay | Admitting: Gastroenterology

## 2021-01-13 LAB — TISSUE TRANSGLUTAMINASE ABS,IGG,IGA
(tTG) Ab, IgA: <1 U/mL
(tTG) Ab, IgG: <1 U/mL

## 2021-01-13 LAB — IGA: Immunoglobulin A: 227 mg/dL (ref 47–310)

## 2021-01-13 NOTE — Progress Notes (Signed)
I agree with the above note, plan 

## 2021-01-15 DIAGNOSIS — F3341 Major depressive disorder, recurrent, in partial remission: Secondary | ICD-10-CM | POA: Diagnosis not present

## 2021-01-15 DIAGNOSIS — F4312 Post-traumatic stress disorder, chronic: Secondary | ICD-10-CM | POA: Diagnosis not present

## 2021-01-15 DIAGNOSIS — F411 Generalized anxiety disorder: Secondary | ICD-10-CM | POA: Diagnosis not present

## 2021-01-19 ENCOUNTER — Ambulatory Visit (AMBULATORY_SURGERY_CENTER): Payer: BC Managed Care – PPO | Admitting: Gastroenterology

## 2021-01-19 ENCOUNTER — Encounter: Payer: Self-pay | Admitting: Gastroenterology

## 2021-01-19 VITALS — BP 103/58 | HR 89 | Temp 97.3°F | Resp 25 | Ht 68.0 in | Wt 165.0 lb

## 2021-01-19 DIAGNOSIS — K2970 Gastritis, unspecified, without bleeding: Secondary | ICD-10-CM

## 2021-01-19 DIAGNOSIS — K2950 Unspecified chronic gastritis without bleeding: Secondary | ICD-10-CM | POA: Diagnosis not present

## 2021-01-19 DIAGNOSIS — K219 Gastro-esophageal reflux disease without esophagitis: Secondary | ICD-10-CM | POA: Diagnosis not present

## 2021-01-19 DIAGNOSIS — R112 Nausea with vomiting, unspecified: Secondary | ICD-10-CM

## 2021-01-19 HISTORY — PX: UPPER GASTROINTESTINAL ENDOSCOPY: SHX188

## 2021-01-19 MED ORDER — SODIUM CHLORIDE 0.9 % IV SOLN: 500.0000 mL | Freq: Once | INTRAVENOUS | Status: DC

## 2021-01-19 NOTE — Progress Notes (Deleted)
Called to procedure.  Patient ID and intended procedure confirmed with present staff. Received instructions for my participation in the procedure from the performing physician.

## 2021-01-19 NOTE — Op Note (Signed)
[facility] Patient Name: Monica Jackson Procedure Date: 01/19/2021 9:56 AM MRN: [id number] Endoscopist: Rachael Fee , MD Age: 30 Referring MD:  Date of Birth: 1990/10/29 Gender: Female Account #: [id number] Procedure:                Upper GI endoscopy Indications:              Nausea with vomiting, gagging Medicines:                Monitored Anesthesia Care Procedure:                Pre-Anesthesia Assessment:                           - Prior to the procedure, a History and Physical                            was performed, and patient medications and                            allergies were reviewed. The patient's tolerance of                            previous anesthesia was also reviewed. The risks                            and benefits of the procedure and the sedation                            options and risks were discussed with the patient.                            All questions were answered, and informed consent                            was obtained. Prior Anticoagulants: The patient has                            taken no previous anticoagulant or antiplatelet                            agents. ASA Grade Assessment: I - A normal, healthy                            patient. After reviewing the risks and benefits,                            the patient was deemed in satisfactory condition to                            undergo the procedure.                           After obtaining informed consent, the endoscope was  passed under direct vision. Throughout the                            procedure, the patient's blood pressure, pulse, and                            oxygen saturations were monitored continuously. The                            GIF [id number] #[id number] was introduced through the                            mouth, and advanced to the second part of duodenum.                            The upper GI endoscopy was  accomplished without                            difficulty. The patient tolerated the procedure                            well. Scope In: Scope Out: Findings:                 Moderate amount of solid food in the stomach.                           Mild inflammation characterized by erythema,                            friability and granularity was found in the gastric                            antrum. Biopsies were taken with a cold forceps for                            histology.                           The exam was otherwise without abnormality. Complications:            No immediate complications. Estimated blood loss:                            None. Estimated Blood Loss:     Estimated blood loss: none. Impression:               - Moderate amount of solid food in the stomach                            without anatomic outlet obstruction. This suggests                            a possible component of gastroparesis.                           -  Mild, non-specific distal gastritis. Biopsies                            taken for H. pylori.                           - The examination was otherwise normal. Recommendation:           - Patient has a contact number available for                            emergencies. The signs and symptoms of potential                            delayed complications were discussed with the                            patient. Return to normal activities tomorrow.                            Written discharge instructions were provided to the                            patient.                           - Resume previous diet.                           - Continue present medications.                           - Await pathology results.                           - Please start taking OTC citrucel orange flavored                            powder fiber supplement, one scoope every day in a                            glass of water. This is for your  chronic                            constipation. Rachael Fee, MD 01/19/2021 10:16:26 AM This report has been signed electronically.

## 2021-01-19 NOTE — Progress Notes (Signed)
Called to room to assist during endoscopic procedure.  Patient ID and intended procedure confirmed with present staff. Received instructions for my participation in the procedure from the performing physician.  

## 2021-01-19 NOTE — Progress Notes (Signed)
Pt in recovery with monitors in place, VSS. Report given to receiving RN. Bite guard was placed with pt awake to ensure comfort. No dental or soft tissue damage noted. 

## 2021-01-19 NOTE — Progress Notes (Signed)
  The recent H&P (dated last week) was reviewed, the patient was examined and there is no change in the patients condition since that H&P was completed.   Rachael Fee  01/19/2021, 10:21 AM

## 2021-01-19 NOTE — Patient Instructions (Signed)
YOU HAD AN ENDOSCOPIC PROCEDURE TODAY AT THE [facility]:   Refer to the procedure report that was given to you for any specific questions about what was found during the examination.  If the procedure report does not answer your questions, please call your gastroenterologist to clarify.  If you requested that your care partner not be given the details of your procedure findings, then the procedure report has been included in a sealed envelope for you to review at your convenience later.  YOU SHOULD EXPECT: Some feelings of bloating in the abdomen. Passage of more gas than usual.  Walking can help get rid of the air that was put into your GI tract during the procedure and reduce the bloating. If you had a lower endoscopy (such as a colonoscopy or flexible sigmoidoscopy) you may notice spotting of blood in your stool or on the toilet paper. If you underwent a bowel prep for your procedure, you may not have a normal bowel movement for a few days.  Please Note:  You might notice some irritation and congestion in your nose or some drainage.  This is from the oxygen used during your procedure.  There is no need for concern and it should clear up in a day or so.  SYMPTOMS TO REPORT IMMEDIATELY:    Following upper endoscopy (EGD)  Vomiting of blood or coffee ground material  New chest pain or pain under the shoulder blades  Painful or persistently difficult swallowing  New shortness of breath  Fever of 100F or higher  Black, tarry-looking stools  For urgent or emergent issues, a gastroenterologist can be reached at any hour by calling [phone]. Do not use MyChart messaging for urgent concerns.    DIET:  We do recommend a small meal at first, but then you may proceed to your regular diet.  Drink plenty of fluids but you should avoid alcoholic beverages for 24 hours.  ACTIVITY:  You should plan to take it easy for the rest of today and you should NOT DRIVE or use heavy machinery  until tomorrow (because of the sedation medicines used during the test).    FOLLOW UP: Our staff will call the number listed on your records 48-72 hours following your procedure to check on you and address any questions or concerns that you may have regarding the information given to you following your procedure. If we do not reach you, we will leave a message.  We will attempt to reach you two times.  During this call, we will ask if you have developed any symptoms of COVID 19. If you develop any symptoms (ie: fever, flu-like symptoms, shortness of breath, cough etc.) before then, please call [phone].  If you test positive for Covid 19 in the 2 weeks post procedure, please call and report this information to us.    If any biopsies were taken you will be contacted by phone or by letter within the next 1-3 weeks.  Please call us at [phone] if you have not heard about the biopsies in 3 weeks.    SIGNATURES/CONFIDENTIALITY: You and/or your care partner have signed paperwork which will be entered into your electronic medical record.  These signatures attest to the fact that that the information above on your After Visit Summary has been reviewed and is understood.  Full responsibility of the confidentiality of this discharge information lies with you and/or your care-partner. 

## 2021-01-19 NOTE — Progress Notes (Signed)
Pt's states no medical or surgical changes since previsit or office visit.  Vitals DT 

## 2021-01-21 ENCOUNTER — Telehealth: Payer: Self-pay

## 2021-01-21 ENCOUNTER — Telehealth: Payer: Self-pay | Admitting: *Deleted

## 2021-01-21 NOTE — Telephone Encounter (Signed)
  Follow up Call-  Call back number 01/19/2021 12/04/2019  Post procedure Call Back phone  # [phone]  Permission to leave phone message Yes Yes  Some recent data might be hidden     Attempted to leave a message but mailbox has not been setup.

## 2021-01-21 NOTE — Telephone Encounter (Signed)
  Follow up Call-  Call back number 01/19/2021 12/04/2019  Post procedure Call Back phone  # [phone]  Permission to leave phone message Yes Yes  Some recent data might be hidden     Patient questions:  Do you have a fever, pain , or abdominal swelling? No. Pain Score  0 *  Have you tolerated food without any problems? Yes.    Have you been able to return to your normal activities? Yes.    Do you have any questions about your discharge instructions: Diet   No. Medications  No. Follow up visit  No.  Do you have questions or concerns about your Care? No.  Actions: * If pain score is 4 or above: No action needed, pain <4.  Have you developed a fever since your procedure? no  2.   Have you had an respiratory symptoms (SOB or cough) since your procedure? no  3.   Have you tested positive for COVID 19 since your procedure no  4.   Have you had any family members/close contacts diagnosed with the COVID 19 since your procedure?  no   If yes to any of these questions please route to Laverna Peace, RN and Karlton Lemon, RN

## 2021-01-25 ENCOUNTER — Other Ambulatory Visit: Payer: Self-pay

## 2021-01-25 DIAGNOSIS — R112 Nausea with vomiting, unspecified: Secondary | ICD-10-CM

## 2021-02-07 ENCOUNTER — Other Ambulatory Visit: Payer: Self-pay | Admitting: Nurse Practitioner

## 2021-02-08 ENCOUNTER — Ambulatory Visit (HOSPITAL_COMMUNITY)
Admission: RE | Admit: 2021-02-08 | Discharge: 2021-02-08 | Disposition: A | Discharge status: Home / Self Care | Payer: BC Managed Care – PPO | Source: Ambulatory Visit | Attending: Gastroenterology | Admitting: Gastroenterology

## 2021-02-08 DIAGNOSIS — R109 Unspecified abdominal pain: Secondary | ICD-10-CM | POA: Diagnosis not present

## 2021-02-08 DIAGNOSIS — R112 Nausea with vomiting, unspecified: Secondary | ICD-10-CM | POA: Diagnosis not present

## 2021-02-08 MED ORDER — TECHNETIUM TC 99M SULFUR COLLOID FILTERED
2.0000 | Freq: Once | INTRAVENOUS | Status: AC | PRN
  Administered 2021-02-08: 07:00:00 2 via INTRADERMAL

## 2021-02-15 ENCOUNTER — Telehealth: Payer: Self-pay

## 2021-02-15 NOTE — Telephone Encounter (Signed)
Patient's mom called in stating that patient was with Dr. Mardelle Matte at a different facility.    Was a patient of Dr. Artis Flock.    Is requesting Dr. Mardelle Matte to take patient back on.   Please advise.

## 2021-02-18 DIAGNOSIS — N765 Ulceration of vagina: Secondary | ICD-10-CM | POA: Diagnosis not present

## 2021-02-18 DIAGNOSIS — R300 Dysuria: Secondary | ICD-10-CM | POA: Diagnosis not present

## 2021-02-18 DIAGNOSIS — N760 Acute vaginitis: Secondary | ICD-10-CM | POA: Diagnosis not present

## 2021-02-18 DIAGNOSIS — N3010 Interstitial cystitis (chronic) without hematuria: Secondary | ICD-10-CM | POA: Diagnosis not present

## 2021-02-19 NOTE — Telephone Encounter (Signed)
Per conversation Mardelle Matte had with Patient's mom, Mardelle Matte has agreed to take patient on.  I have scheduled patient.

## 2021-02-25 DIAGNOSIS — F331 Major depressive disorder, recurrent, moderate: Secondary | ICD-10-CM | POA: Diagnosis not present

## 2021-03-10 ENCOUNTER — Other Ambulatory Visit: Payer: Self-pay | Admitting: Nurse Practitioner

## 2021-03-16 DIAGNOSIS — J310 Chronic rhinitis: Secondary | ICD-10-CM | POA: Diagnosis not present

## 2021-03-16 DIAGNOSIS — L989 Disorder of the skin and subcutaneous tissue, unspecified: Secondary | ICD-10-CM | POA: Diagnosis not present

## 2021-03-16 DIAGNOSIS — D2239 Melanocytic nevi of other parts of face: Secondary | ICD-10-CM | POA: Diagnosis not present

## 2021-03-16 DIAGNOSIS — J3489 Other specified disorders of nose and nasal sinuses: Secondary | ICD-10-CM | POA: Diagnosis not present

## 2021-03-16 DIAGNOSIS — J342 Deviated nasal septum: Secondary | ICD-10-CM | POA: Diagnosis not present

## 2021-03-19 ENCOUNTER — Other Ambulatory Visit: Payer: Self-pay | Admitting: Nurse Practitioner

## 2021-04-29 ENCOUNTER — Telehealth (INDEPENDENT_AMBULATORY_CARE_PROVIDER_SITE_OTHER): Payer: BC Managed Care – PPO | Admitting: Family Medicine

## 2021-04-29 DIAGNOSIS — R0981 Nasal congestion: Secondary | ICD-10-CM | POA: Diagnosis not present

## 2021-04-29 DIAGNOSIS — J029 Acute pharyngitis, unspecified: Secondary | ICD-10-CM

## 2021-04-29 DIAGNOSIS — R059 Cough, unspecified: Secondary | ICD-10-CM | POA: Diagnosis not present

## 2021-04-29 NOTE — Progress Notes (Signed)
Virtual Visit via Video Note  I connected with Monica Jackson  on 04/29/21 at  3:00 PM EST by a video enabled telemedicine application and verified that I am speaking with the correct person using two identifiers.  Location patient: [place] Location provider:work or home office Persons participating in the virtual visit: patient, provider  I discussed the limitations and requested verbal permission for telemedicine visit. The patient expressed understanding and agreed to proceed.   HPI:  Acute telemedicine visit for : -Onset: 1 week ago -Symptoms include: scratchy throat, fever initially, sore throat -saw a family doc when this first happened and she started a zpack - symptoms improved but now has some nasal congestion, some thick mucus, some streaks of blood in mucus from the nose, sore throat, cough -Denies: fever, CP, SOB, NVD beyond baseline -domestic travel recently -Pertinent past medical history: see below -Pertinent medication allergies: -COVID-19 vaccine status:  Immunization History  Administered Date(s) Administered   DTaP 03/01/1993, 05/04/1993, 08/17/1993, 06/29/1994, 02/16/1998   HPV Quadrivalent 07/27/2009, 10/22/2009, 03/02/2010   Hepatitis A, Ped/Adol-2 Dose 07/27/2009, 02/16/2010   Hepatitis B, ped/adol 06/17/1992, 01/27/1993, 08/17/1993   HiB (PRP-OMP) 03/01/1993, 05/04/1993, 08/17/1993, 06/29/1994   Influenza Nasal 02/16/2010   Influenza Split 03/21/2012   Influenza-Unspecified 03/02/2010   MMR 01/18/1994, 02/16/1998   Meningococcal Conjugate 07/27/2009   Meningococcal Polysaccharide 04/07/2011   OPV 03/01/1993, 05/04/1993, 08/17/1993, 02/16/1998   Td 06/18/2009   Tdap 06/18/2009   Varicella 01/18/1994     ROS: See pertinent positives and negatives per HPI.  Past Medical History:  Diagnosis Date   Allergy    Anxiety    Depression    GERD (gastroesophageal reflux disease)    Glaucoma    "early signs of" per pt   H/O bladder infections    IBS (irritable bowel  syndrome)    Seizures ([place])    1 time only- 2015- none since then   Substance abuse [facility])     Past Surgical History:  Procedure Laterality Date   COSMETIC SURGERY     LEEP N/A 08/27/2013   Procedure: LOOP ELECTROSURGICAL EXCISION PROCEDURE (LEEP) CONE BIOPSY;  Surgeon: Cyril Mourning, MD;  Location: [facility];  Service: Gynecology;  Laterality: N/A;   UPPER GASTROINTESTINAL ENDOSCOPY  01/19/2021     Current Outpatient Medications:    clonazePAM (KLONOPIN) 0.5 MG tablet, Take 0.5 mg by mouth 2 (two) times daily., Disp: , Rfl:    desvenlafaxine (PRISTIQ) 100 MG 24 hr tablet, Take by mouth. , Disp: , Rfl:    docusate sodium (COLACE) 100 MG capsule, Take 100 mg by mouth daily., Disp: , Rfl:    fluticasone (FLONASE) 50 MCG/ACT nasal spray, 2 sprays by Each Nare route daily., Disp: , Rfl:    hyoscyamine (LEVBID) 0.375 MG 12 hr tablet, Take 0.375 mg by mouth 2 (two) times daily., Disp: , Rfl:    levonorgestrel (MIRENA) 20 MCG/24HR IUD, 1 each by Intrauterine route once., Disp: , Rfl:    omeprazole (PRILOSEC) 20 MG capsule, TAKE 1 CAPSULE 30 MINUTES BEFORE BREAKFAST EVERY DAY., Disp: 90 capsule, Rfl: 1   ondansetron (ZOFRAN) 4 MG tablet, Take 1 tablet (4 mg total) by mouth every 8 (eight) hours as needed for nausea or vomiting., Disp: 30 tablet, Rfl: 0   prazosin (MINIPRESS) 1 MG capsule, TAKE 1 CAPSULE BY MOUTH EVERYDAY AT BEDTIME, Disp: , Rfl:    SUMAtriptan (IMITREX) 50 MG tablet, Take 1 tablet (50 mg total) by mouth every 2 (two) hours as needed for migraine. Take  1 tablet at onset of headache, may repeat in 2 hours, Disp: 10 tablet, Rfl: 6   topiramate (TOPAMAX) 100 MG tablet, TAKE 1 TABLET BY MOUTH TWICE A DAY, Disp: 180 tablet, Rfl: 0   traZODone (DESYREL) 150 MG tablet, Take 150 mg by mouth at bedtime., Disp: , Rfl:   EXAM:  VITALS per patient if applicable:  GENERAL: alert, oriented, appears well and in no acute distress  HEENT: atraumatic, conjunttiva clear, no obvious  abnormalities on inspection of external nose and ears, red papule upper lip (pt reports cold sore and on valtrex - reports resolving), moist mucus membranes, does have PND, no tonsillar edema or exudate on limited video visit exam  NECK: normal movements of the head and neck  LUNGS: on inspection no signs of respiratory distress, breathing rate appears normal, no obvious gross SOB, gasping or wheezing  CV: no obvious cyanosis  MS: moves all visible extremities without noticeable abnormality  PSYCH/NEURO: pleasant and cooperative, no obvious depression or anxiety, speech and thought processing grossly intact  ASSESSMENT AND PLAN:  Discussed the following assessment and plan:  Nasal congestion  Sore throat  Cough, unspecified type  -we discussed possible serious and likely etiologies, options for evaluation and workup, limitations of telemedicine visit vs in person visit, treatment, treatment risks and precautions. Pt is agreeable to treatment via telemedicine at this moment. Suspect VURI, possible covid19 vs other. Fevers have resolved. No SOB/CP. She has opted to try nasal saline sinus rinse, nsaid, salt water gargles and other symptomatic care measures per pt instructions.  Work/School slipped offered: declined Advised to seek prompt virtual visit or in person care if worsening, new symptoms arise, or if is not improving with treatment as expected per our conversation of expected course.  I discussed the assessment and treatment plan with the patient. The patient was provided an opportunity to ask questions and all were answered. The patient agreed with the plan and demonstrated an understanding of the instructions.     Lucretia Kern, DO

## 2021-04-29 NOTE — Patient Instructions (Addendum)
°  HOME CARE TIPS:  -COVID19 testing information: [email or web address]  Most pharmacies also offer testing and home test kits.    -can use  aleve if needed for fevers, aches and pains per instructions  -warm salt water gargles twice daily for 3 days  -can use nasal saline a few times per day if you have nasal congestion  -stay hydrated, drink plenty of fluids and eat small healthy meals - avoid dairy  -can take 1000 IU ( ) Vit D3 and 100-500 mg of Vit C daily per instructions  -If the Covid test is positive, check out the [facility] website for more information on home care, transmission and treatment for COVID19  -follow up with your doctor in 2-3 days unless improving and feeling better  -f you have COVID19, you will likely be contagious for 7-10 days. Flu or Influenza is likely contagious for about 7 days. Other respiratory viral infections remain contagious for 5-10+ days depending on the virus and many other factors.   It was nice to meet you today, and I really hope you are feeling better soon. I help [facility] out with telemedicine visits on Tuesdays and Thursdays and am happy to help if you need a follow up virtual visit on those days. Otherwise, if you have any concerns or questions following this visit please schedule a follow up visit with your Primary Care doctor or seek care at a local urgent care clinic to avoid delays in care.    Seek in person care or schedule a follow up video visit promptly if your symptoms worsen, new concerns arise or you are not improving with treatment. Call 911 and/or seek emergency care if your symptoms are severe or life threatening.

## 2021-05-13 DIAGNOSIS — F411 Generalized anxiety disorder: Secondary | ICD-10-CM | POA: Diagnosis not present

## 2021-05-13 DIAGNOSIS — F4312 Post-traumatic stress disorder, chronic: Secondary | ICD-10-CM | POA: Diagnosis not present

## 2021-05-13 DIAGNOSIS — F3341 Major depressive disorder, recurrent, in partial remission: Secondary | ICD-10-CM | POA: Diagnosis not present

## 2021-05-17 ENCOUNTER — Ambulatory Visit: Payer: BC Managed Care – PPO | Admitting: Nurse Practitioner

## 2021-05-17 ENCOUNTER — Encounter: Payer: Self-pay | Admitting: Nurse Practitioner

## 2021-05-17 ENCOUNTER — Other Ambulatory Visit (INDEPENDENT_AMBULATORY_CARE_PROVIDER_SITE_OTHER): Payer: BC Managed Care – PPO

## 2021-05-17 VITALS — BP 102/64 | HR 70 | Ht 69.0 in | Wt 165.0 lb

## 2021-05-17 DIAGNOSIS — R1033 Periumbilical pain: Secondary | ICD-10-CM

## 2021-05-17 DIAGNOSIS — K5900 Constipation, unspecified: Secondary | ICD-10-CM

## 2021-05-17 DIAGNOSIS — R112 Nausea with vomiting, unspecified: Secondary | ICD-10-CM | POA: Diagnosis not present

## 2021-05-17 DIAGNOSIS — R197 Diarrhea, unspecified: Secondary | ICD-10-CM

## 2021-05-17 DIAGNOSIS — R1030 Lower abdominal pain, unspecified: Secondary | ICD-10-CM

## 2021-05-17 LAB — SEDIMENTATION RATE: Sed Rate: 7 mm/hr (ref 0–20)

## 2021-05-17 LAB — BASIC METABOLIC PANEL
BUN: 16 mg/dL (ref 6–23)
CO2: 24 mEq/L (ref 19–32)
Calcium: 9 mg/dL (ref 8.4–10.5)
Chloride: 108 mEq/L (ref 96–112)
Creatinine, Ser: 1.01 mg/dL (ref 0.40–1.20)
GFR: 75.82 mL/min (ref >60.00)
Glucose, Bld: 90 mg/dL (ref 70–99)
Potassium: 3.6 mEq/L (ref 3.5–5.1)
Sodium: 140 mEq/L (ref 135–145)

## 2021-05-17 LAB — C-REACTIVE PROTEIN: CRP: <1 mg/dL (ref 0.5–20.0)

## 2021-05-17 MED ORDER — HYOSCYAMINE SULFATE 0.125 MG SL SUBL
0.1250 mg | SUBLINGUAL_TABLET | Freq: Three times a day (TID) | SUBLINGUAL | 0 refills | Status: DC
Start: 2021-05-17 — End: 2021-06-15

## 2021-05-17 NOTE — Patient Instructions (Addendum)
Please proceed to the basement level for lab work before leaving today. Press "B" on the elevator. The lab is located at the first door on the left as you exit the elevator. ? ?HEALTHCARE LAWS AND MY CHART RESULTS:  ? ?Due to recent changes in healthcare laws, you may see results of your imaging and/or laboratory studies on MyChart before I have had a chance to review them.  I understand that in some cases there may be results that are confusing or concerning to you. Please understand that not all results are received at the same time and often I may need to interpret multiple results in order to provide you with the best plan of care or course of treatment. Therefore, I ask that you please give me 48 hours to thoroughly review all your results before contacting my office for clarification.  ? ?You will be contacted by [facility] Scheduling (Your caller ID will indicate phone # [phone]) in the next 7 days to schedule your CT Scan. If you have not heard from them within 7 business days, please call [facility] Scheduling at [phone] to follow up on the status of your appointment.   ? ?We have sent the following medication to your pharmacy for you to pick up at your convenience: ? ?Hyoscyamine (LEVSIN SL) 0.125 MG SL tablet: Place 1 tablet (0.125 mg total) under the tongue 3 times a day before meals as needed. ? ?RECOMMENDATIONS: ? ?Take a probiotic of your choice once a day. ?Lactaid 1-2 tablets with each dairy product. ?Benefiber- 1 tablespoon daily. ? ?Thank you for trusting me with your gastrointestinal care!   ? ?Arnaldo Natal, CRNP ? ? ? ?BMI: ? ?If you are age 14 or older, your body mass index should be between 23-30. Your Body mass index is 24.37 kg/m?Marland Kitchen If this is out of the aforementioned range listed, please consider follow up with your Primary Care Provider. ? ?If you are age 55 or younger, your body mass index should be between 19-25. Your Body mass index is 24.37  kg/m?Marland Kitchen If this is out of the aformentioned range listed, please consider follow up with your Primary Care Provider.  ? ?MY CHART: ? ?The [facility] providers would like to encourage you to use [facility] to communicate with providers for non-urgent requests or questions.  Due to long hold times on the telephone, sending your provider a message by [facility] may be a faster and more efficient way to get a response.  Please allow 48 business hours for a response.  Please remember that this is for non-urgent requests.  ? ?   ?  ? ?

## 2021-05-17 NOTE — Progress Notes (Signed)
05/17/2021 Mosetta Pigeon [id number] [birth year]   CHIEF COMPLAINT:  Nausea, vomiting, diarrhea and constipation   HISTORY OF PRESENT ILLNESS:  Kathrin Greathouse. Gaskins is a 31 year old female with a history of anxiety, migraine headaches and alternating constipation and diarrhea.She is followed by Dr .Christella Hartigan. I initially saw Ritta Slot in in the office 01/12/2021 due to having N/V, alternating diarrhea and constipation. She underwent an EGD 01/19/2021 which showed a moderate amount of solid food in the stomach suggestive of gastroparesis, no evidence of a gastric outlet obstruction and mild chronic gastritis. Biopsies were negative for H. Pylori. She underwent a gastric empty study 02/08/2021 which was normal. As previously reviewed, she underwent a colonoscopy 12/04/2019 which was normal.   She presents today with the same GI concerns. She has nausea most days and vomits up partially digested food once monthly. She passes brown nonbloody liquid or "sand like or worm like" stools for a few days then goes 4 to 5 days without passing a BM. By the 5th or 6th day, she passes a solid stool followed by several loose stools, feels plugged up and boated. She often sees undigested food ie berries in her stool. She passed 3 mud like stools yesterday. No recent rectal bleeding. She has central abdominal pain shortly after eating, no specific foods. She underwent an abdominal sonogram 09/26/2019 which was normal. She often avoids eating until the evening as she has less GI symptoms when she does not eat during the day and while at work. 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 She stated having the same GI symptoms even when she is at home and off work for 7 to 10 days at at time. She remains on Omeprazole 20mg  QD. No marijuana use. No alcohol use. No family history of celiac disease or IBD.   CBC Latest Ref Rng & Units 01/12/2021 11/13/2019 08/27/2013  WBC 4.0 - 10.5 K/uL 6.3 6.0 6.8   Hemoglobin 12.0 - 15.0 g/dL 08/29/2013 51.7 61.6  Hematocrit 36.0 - 46.0 % 41.1 41.1 38.0  Platelets 150.0 - 400.0 K/uL 286.0 310.0 266  TTG IgA ab < 1.0, TTG IgG ab < 1.0 and  IgA 227 on 01/12/2021  PAST IMAGE STUDIES;   Abdominal sonogram 09/26/2019: Gallbladder: No gallstones or wall thickening visualized. No sonographic Murphy sign noted by sonographer.   Common bile duct: Diameter: 3 mm   Liver: No focal lesion identified. Within normal limits in parenchymal echogenicity. Portal vein is patent on color Doppler imaging with normal direction of blood flow towards the liver.   IVC: No abnormality visualized.   Pancreas: Visualized portion unremarkable.   Spleen: Size and appearance within normal limits.   Right Kidney: Length: 11.5 cm. Echogenicity within normal limits. No mass or hydronephrosis visualized.   Left Kidney: Length: 10.5 cm. Echogenicity within normal limits. No mass or hydronephrosis visualized.   Abdominal aorta: No aneurysm visualized.   Other findings: No free fluid or ascites   IMPRESSION: Normal abdominal ultrasound  Gastric empty study 02/08/2021: FINDINGS: Expected location of the stomach in the left upper quadrant. Ingested meal empties the stomach gradually over the course of the study.   20% emptied at 1 hr ( normal >= 10%)   46% emptied at 2 hr ( normal >= 40%)   72% emptied at 3 hr ( normal >= 70%)   98% emptied at 4 hr ( normal >= 90%)   IMPRESSION: Normal gastric emptying  study.  PAST GI PROCEDURES:   EGD 01/19/2021: - Moderate amount of solid food in the stomach without anatomic outlet obstruction. This suggests a possible component of gastroparesis. - Mild, non-specific distal gastritis. Biopsies taken for H. pylori. - The examination was otherwise normal. MILD CHRONIC GASTRITIS. NO HELICOBACTER PYLORI ORGANISMS IDENTIFIED. NO INTESTINAL METAPLASIA, DYSPLASIA OR MALIGNANCY IDENTIFIED. [facility]AMM[facility] PATHAN  Colonoscopy  12/04/2019: - The terminal ileum appeared normal. - The entire examined colon appeared normal on direct and retroflexion views.  CBC Latest Ref Rng & Units 01/12/2021 11/13/2019 08/27/2013  WBC 4.0 - 10.5 K/uL 6.3 6.0 6.8  Hemoglobin 12.0 - 15.0 g/dL 81.2 75.1 70.0  Hematocrit 36.0 - 46.0 % 41.1 41.1 38.0  Platelets 150.0 - 400.0 K/uL 286.0 310.0 266    CMP Latest Ref Rng & Units 01/12/2021 11/13/2019 07/10/2013  Glucose 70 - 99 mg/dL 174(B) 449(Q) 99  BUN 6 - 23 mg/dL 12 11 9   Creatinine 0.40 - 1.20 mg/dL 7.59 1.63  Sodium 135 - 145 mEq/L 141 140 139  Potassium 3.5 - 5.1 mEq/L 3.7 3.4(L) 3.5(L)  Chloride 96 - 112 mEq/L 108 106 102  CO2 19 - 32 mEq/L 23 26 24   Calcium 8.4 - 10.5 mg/dL 9.4 9.5 9.5  Total Protein 6.0 - 8.3 g/dL 7.6 7.6 -  Total Bilirubin 0.2 - 1.2 mg/dL 0.6 0.3 -  Alkaline Phos 39 - 117 U/L 51 72 -  AST 0 - 37 U/L 12 16 -  ALT 0 - 35 U/L 8 12 -     Past Medical History:  Diagnosis Date   Allergy    Anxiety    Depression    GERD (gastroesophageal reflux disease)    Glaucoma    "early signs of" per pt   H/O bladder infections    IBS (irritable bowel syndrome)    Seizures (HCC)    1 time only- 2015- none since then   Substance abuse (HCC)    Past Surgical History:  Procedure Laterality Date   COSMETIC SURGERY     LEEP N/A 08/27/2013   Procedure: LOOP ELECTROSURGICAL EXCISION PROCEDURE (LEEP) CONE BIOPSY;  Surgeon: 2016, MD;  Location: [facility];  Service: Gynecology;  Laterality: N/A;   UPPER GASTROINTESTINAL ENDOSCOPY  01/19/2021      Allergies  Allergen Reactions   Sulfa Antibiotics Nausea And Vomiting and Other (See Comments)    Crazy feeling      Outpatient Encounter Medications as of 05/17/2021  Medication Sig   clonazePAM (KLONOPIN) 0.5 MG tablet Take 0.5 mg by mouth 2 (two) times daily.   desvenlafaxine (PRISTIQ) 100 MG 24 hr tablet Take by mouth.    docusate sodium (COLACE) 100 MG capsule Take 100 mg by mouth daily.   fluticasone  (FLONASE) 50 MCG/ACT nasal spray 2 sprays by Each Nare route daily.   hyoscyamine (LEVBID) 0.375 MG 12 hr tablet Take 0.375 mg by mouth 2 (two) times daily.   levonorgestrel (MIRENA) 20 MCG/24HR IUD 1 each by Intrauterine route once.   omeprazole (PRILOSEC) 20 MG capsule TAKE 1 CAPSULE 30 MINUTES BEFORE BREAKFAST EVERY DAY.   ondansetron (ZOFRAN) 4 MG tablet Take 1 tablet (4 mg total) by mouth every 8 (eight) hours as needed for nausea or vomiting.   prazosin (MINIPRESS) 1 MG capsule TAKE 1 CAPSULE BY MOUTH EVERYDAY AT BEDTIME   SUMAtriptan (IMITREX) 50 MG tablet Take 1 tablet (50 mg total) by mouth every 2 (two) hours as needed for migraine. Take 1 tablet at  onset of headache, may repeat in 2 hours   topiramate (TOPAMAX) 100 MG tablet TAKE 1 TABLET BY MOUTH TWICE A DAY   traZODone (DESYREL) 150 MG tablet Take 150 mg by mouth at bedtime.   No facility-administered encounter medications on file as of 05/17/2021.    REVIEW OF SYSTEMS:  Gen: Denies fever, sweats or chills. No weight loss.  CV: Denies chest pain, palpitations or edema. Resp: Denies cough, shortness of breath of hemoptysis.  GI: See HPI. .  GU : Denies urinary burning, blood in urine, increased urinary frequency or incontinence. MS: Denies joint pain, muscles aches or weakness. Derm: Denies rash, itchiness, skin lesions or unhealing ulcers. Psych: + Anxiety, PTSD.  Heme: Denies bruising, easy bleeding. Neuro:  Denies headaches, dizziness or paresthesias. Endo:  Denies any problems with DM, thyroid or adrenal function.  PHYSICAL EXAM: BP 102/64    Pulse 70    Ht 5\' 9"  (1.753 m)    Wt 165 lb (74.8 kg)    BMI 24.37 kg/m   Wt Readings from Last 3 Encounters:  05/17/21 165 lb (74.8 kg)  01/19/21 165 lb (74.8 kg)  01/12/21 165 lb (74.8 kg)    LMP: 05/17/2021 General: 31 year old female in NAD.  Head: Normocephalic and atraumatic. Eyes:  Sclerae non-icteric, conjunctive pink. Ears: Normal auditory acuity. Mouth: Dentition  intact. No ulcers or lesions.  Neck: Supple, no lymphadenopathy or thyromegaly.  Lungs: Clear bilaterally to auscultation without wheezes, crackles or rhonchi. Heart: Regular rate and rhythm. No murmur, rub or gallop appreciated.  Abdomen: Soft, non distended. Mild peri umbilical tenderness. No masses. No hepatosplenomegaly. Normoactive bowel sounds x 4 quadrants.  Rectal:  Musculoskeletal: Symmetrical with no gross deformities. Skin: Warm and dry. No rash or lesions on visible extremities. Extremities: No edema. Neurological: Alert oriented x 4, no focal deficits.  Psychological:  Alert and cooperative. Normal mood and affect.  ASSESSMENT AND PLAN:  59) 31 year old female with frequent nausea and vomiting once monthly. EGD 01/2021 showed retained food in the stomach and mild gastritis. Gastric empty study 01/2021 was normal, no evidence of gastroparesis. No narcotic use. No Marijuana use.  -Continue Omeprazole 20mg  QD for now -See plan in # 2   2) Periumbilical pain and bloat  -Food allergy panel, sed rate and CRP -CTAP with contrast to further evaluate the small bowel  -Hyoscyamine 0.125mg  one tab Sl Q 6 to 8 hrs PRN abd pain  -Consider SIBO test if the above lab and CT results unrevealing   3) Altered bowel pattern, diarrhea and constipation. Normal colonoscopy, no colitis 11/2019 -Fecal calprotectin  -Benefiber as tolerate  -Probiotic of choice once daily -Lactaid with each dairy product     CC:  Willow Ora, MD

## 2021-05-18 ENCOUNTER — Encounter: Payer: Self-pay | Admitting: Nurse Practitioner

## 2021-05-18 LAB — FOOD ALLERGY PROFILE
Allergen, Salmon, f41: <0.1 kU/L
Almonds: <0.1 kU/L
CLASS: 0
CLASS: 0
CLASS: 0
CLASS: 0
CLASS: 0
CLASS: 0
CLASS: 0
CLASS: 0
CLASS: 0
CLASS: 0
CLASS: 0
Cashew IgE: <0.1 kU/L
Class: 0
Class: 0
Class: 0
Class: 0
Egg White IgE: <0.1 kU/L
Fish Cod: <0.1 kU/L
Hazelnut: <0.1 kU/L
Milk IgE: <0.1 kU/L
Peanut IgE: <0.1 kU/L
Scallop IgE: <0.1 kU/L
Sesame Seed f10: <0.1 kU/L
Shrimp IgE: <0.1 kU/L
Soybean IgE: <0.1 kU/L
Tuna IgE: <0.1 kU/L
Walnut: <0.1 kU/L
Wheat IgE: <0.1 kU/L

## 2021-05-18 LAB — INTERPRETATION:

## 2021-05-18 NOTE — Progress Notes (Signed)
I agree with the above note, plan 

## 2021-05-24 ENCOUNTER — Encounter: Payer: Self-pay | Admitting: Family Medicine

## 2021-05-24 ENCOUNTER — Other Ambulatory Visit (HOSPITAL_COMMUNITY)
Admission: RE | Admit: 2021-05-24 | Discharge: 2021-05-24 | Disposition: A | Discharge status: Home / Self Care | Payer: BC Managed Care – PPO | Source: Ambulatory Visit | Attending: Family Medicine | Admitting: Family Medicine

## 2021-05-24 ENCOUNTER — Ambulatory Visit: Payer: BC Managed Care – PPO | Admitting: Family Medicine

## 2021-05-24 VITALS — BP 111/73 | HR 83 | Temp 98.4°F | Ht 69.0 in | Wt 164.6 lb

## 2021-05-24 DIAGNOSIS — Z23 Encounter for immunization: Secondary | ICD-10-CM

## 2021-05-24 DIAGNOSIS — Z975 Presence of (intrauterine) contraceptive device: Secondary | ICD-10-CM

## 2021-05-24 DIAGNOSIS — N898 Other specified noninflammatory disorders of vagina: Secondary | ICD-10-CM | POA: Diagnosis not present

## 2021-05-24 DIAGNOSIS — N398 Other specified disorders of urinary system: Secondary | ICD-10-CM

## 2021-05-24 DIAGNOSIS — M6289 Other specified disorders of muscle: Secondary | ICD-10-CM

## 2021-05-24 DIAGNOSIS — Z9889 Other specified postprocedural states: Secondary | ICD-10-CM | POA: Diagnosis not present

## 2021-05-24 DIAGNOSIS — F339 Major depressive disorder, recurrent, unspecified: Secondary | ICD-10-CM

## 2021-05-24 DIAGNOSIS — F4310 Post-traumatic stress disorder, unspecified: Secondary | ICD-10-CM

## 2021-05-24 DIAGNOSIS — K588 Other irritable bowel syndrome: Secondary | ICD-10-CM | POA: Diagnosis not present

## 2021-05-24 DIAGNOSIS — Z113 Encounter for screening for infections with a predominantly sexual mode of transmission: Secondary | ICD-10-CM | POA: Diagnosis not present

## 2021-05-24 DIAGNOSIS — G43009 Migraine without aura, not intractable, without status migrainosus: Secondary | ICD-10-CM

## 2021-05-24 DIAGNOSIS — K2970 Gastritis, unspecified, without bleeding: Secondary | ICD-10-CM | POA: Insufficient documentation

## 2021-05-24 HISTORY — DX: Presence of (intrauterine) contraceptive device: Z97.5

## 2021-05-24 NOTE — Progress Notes (Signed)
Subjective  CC:  Chief Complaint  Patient presents with   Transitions Of Care   Vaginal Odor    Pt c/o whit/greyish discharge with odor. Symptoms started 2-3 days ago. Swollen vulva. She has not used any OTC medications.    Vaginal Discharge    HPI: Monica Jackson is a 31 y.o. female is a former [facility] patient and is here to reestablish care with me today.    She has the following concerns or needs: 31 year old female in monogamous relationship presents with complaints of vaginal discharge for the last 2 to 3 days.  Has history of recurrent BV and yeast.  Denies vaginal itching but does have some odor.  Denies risk of STDs.  Has IUD, Mirena in place with minimal menstrual cycles.  This is worked well for her.  She has no pelvic pain, fevers or chills. Here to establish care, transferring care.  I took care of her many years ago at new [facility].  I reviewed multiple records.  She has multiple chronic problems and multiple specialists Urology for dysfunctional voiding and pelvic floor dysfunction.  She did have urodynamic testing and physical therapy.  She feels this is doing better. History of CIN-2 status post LEEP many years ago.  She reports she had a recent Pap smear with Dr. Tressia Danas.  Follow-up has been normal. Irritable bowel syndrome and nausea and vomiting with recent evaluation by GI.  I reviewed notes.  Had endoscopy and colonoscopy.  There was undigested food in the stomach but she did have a negative gastric emptying study.  Colonoscopy was normal.  She is being treated for gastritis chronically.  IBS symptoms are manageable. Psychiatry: Follows with Dr. Gilberto Better for mood disorder.  Feels this is well controlled. Migraines: Has been on chronic Topamax which is worked well. Remote history of possible seizure.  But no known seizure disorder.  Has seen neurology.  He has been evaluated by headache specialist in the past.  Assessment  1. Vaginal discharge   2.  History of loop electrical excision procedure (LEEP)   3. Dysfunctional voiding of urine   4. Other irritable bowel syndrome   5. IUD (intrauterine device) in place   6. Migraine without aura and without status migrainosus, not intractable   7. Pelvic floor dysfunction   8. Major depression, recurrent, chronic ([place])   9. PTSD (post-traumatic stress disorder)   10. Gastritis without bleeding, unspecified chronicity, unspecified gastritis type   11. Need for immunization against influenza       Plan   Vaginitis: We will test for BV, yeast, GC and chlamydia.  Treat once results return.  Education given. Chronic medical problems: All currently well controlled.  Reviewed specialist notes.  No medication changes made today..  Follow up: 6 months for complete physical with lab work  Orders Placed This Encounter  Procedures   Flu Vaccine QUAD 68moIM (Fluarix, Fluzone & Alfiuria Quad PF)   No orders of the defined types were placed in this encounter.     We updated and reviewed the patient's past history in detail and it is documented below.  Patient Active Problem List   Diagnosis Date Noted   PTSD (post-traumatic stress disorder) 03/15/2018    Priority: High   History of seizure 07/13/2013    Priority: High    2015. x1 only. ? If drug related.     Major depression, recurrent, chronic ([place] 07/13/2013    Priority: High   IUD (intrauterine device) in  place 05/24/2021    Priority: Medium     mirena    IBS (irritable bowel syndrome) 11/12/2019    Priority: Medium     Colonoscopy 11/2019. Has been doing well.     Pelvic floor dysfunction 08/08/2019    Priority: Medium     Done PT. Brought on by stress.     Deviated nasal septum 04/09/2019    Priority: Medium    Chronic rhinitis 04/09/2019    Priority: Medium    Dysfunctional voiding of urine 08/01/2016    Priority: [place] by urodynamics    History of loop electrical excision procedure (LEEP)  08/16/2013    Priority: Medium     CIN2 2015; Dr. 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 Deviated septum and enlarged turbinates. Dr. Carlis Abbott. Possible surgical intervention.     Gastritis without bleeding 05/24/2021   Health Maintenance  Topic Date Due   Hepatitis C Screening  Never done   TETANUS/TDAP  06/19/2019   COVID-19 Vaccine (1) 06/09/2021 (Originally 06/28/1993)   PAP-Cervical Cytology Screening  05/25/2022 (Originally 06/30/2017)   PAP SMEAR-Modifier  05/13/2022   INFLUENZA VACCINE  Completed   HPV VACCINES  Completed   HIV Screening  Completed   Immunization History  Administered Date(s) Administered   DTaP 03/01/1993, 05/04/1993, 08/17/1993, 06/29/1994, 02/16/1998   HPV Quadrivalent 07/27/2009, 10/22/2009, 03/02/2010   Hepatitis A, Ped/Adol-2 Dose 07/27/2009, 02/16/2010   Hepatitis B, ped/adol [birth year]20[birth year]92, 01/27/1993, 08/17/1993   HiB (PRP-OMP) 03/01/1993, 05/04/1993, 08/17/1993, 06/29/1994   Influenza Nasal 02/16/2010   Influenza Split 03/21/2012   Influenza,inj,Quad PF,6+ Mos 05/24/2021   Influenza-Unspecified 03/02/2010   MMR 01/18/1994, 02/16/1998   Meningococcal Conjugate 07/27/2009   Meningococcal Polysaccharide 04/07/2011   OPV 03/01/1993, 05/04/1993, 08/17/1993, 02/16/1998   Td 06/18/2009   Tdap 06/18/2009   Varicella 01/18/1994   Current Meds  Medication Sig   clonazePAM (KLONOPIN) 0.5 MG tablet Take 0.5 mg by mouth 2 (two) times daily.   desvenlafaxine (PRISTIQ) 100 MG 24 hr tablet Take 50 mg by mouth.   fluticasone (FLONASE) 50 MCG/ACT nasal spray 2 sprays by Each Nare route daily.   hyoscyamine (LEVSIN SL) 0.125 MG SL tablet Place 1 tablet (0.125 mg total) under the tongue 3 (three) times daily before meals.   lamoTRIgine (LAMICTAL) 25 MG tablet Take 25 mg by mouth daily.   levonorgestrel (MIRENA) 20 MCG/24HR IUD 1 each by Intrauterine route once.    omeprazole (PRILOSEC) 20 MG capsule TAKE 1 CAPSULE 30 MINUTES BEFORE BREAKFAST EVERY DAY.   ondansetron (ZOFRAN) 4 MG tablet Take 1 tablet (4 mg total) by mouth every 8 (eight) hours as needed for nausea or vomiting.   prazosin (MINIPRESS) 1 MG capsule TAKE 1 CAPSULE BY MOUTH EVERYDAY AT BEDTIME   SUMAtriptan (IMITREX) 50 MG tablet Take 1 tablet (50 mg total) by mouth every 2 (two) hours as needed for migraine. Take 1 tablet at onset of headache, may repeat in 2 hours   topiramate (TOPAMAX) 100 MG tablet TAKE 1 TABLET BY MOUTH TWICE A DAY   traZODone (DESYREL) 150 MG tablet Take 150 mg by mouth at bedtime.    Allergies: Patient is allergic to sulfa antibiotics. Past Medical History Patient  has a past medical history of Allergy, Anxiety, Depression, GERD (gastroesophageal reflux disease), Glaucoma, H/O bladder infections, IBS (irritable bowel syndrome),  Seizures ([place]), and Substance abuse ([place]). Past Surgical History Patient  has a past surgical history that includes Cosmetic surgery; LEEP (N/A, 08/27/2013); and Upper gastrointestinal endoscopy (01/19/2021). Family History: Patient family history includes Cancer in her paternal grandmother; Colon polyps in her paternal grandfather; Diabetes in her maternal grandfather, maternal grandmother, and mother; Heart disease in her maternal grandfather; Hyperlipidemia in her father; Hypertension in her paternal grandfather. Social History:  Patient  reports that she has quit smoking. She has never used smokeless tobacco. She reports current alcohol use. She reports that she does not currently use drugs.  Review of Systems: Constitutional: negative for fever or malaise Ophthalmic: negative for photophobia, double vision or loss of vision Cardiovascular: negative for chest pain, dyspnea on exertion, or new LE swelling Respiratory: negative for SOB or persistent cough Gastrointestinal: negative for abdominal pain, change in bowel habits or  melena Genitourinary: negative for dysuria or gross hematuria Musculoskeletal: negative for new gait disturbance or muscular weakness Integumentary: negative for new or persistent rashes Neurological: negative for TIA or stroke symptoms Psychiatric: negative for SI or delusions Allergic/Immunologic: negative for hives  Patient Care Team    Relationship Specialty Notifications Start End  Leamon Arnt, MD PCP - General Family Medicine  04/29/21   Lavonna Monarch, MD Consulting Physician Dermatology  07/31/19   Janne Napoleon, PA-C (Inactive)  Dermatology  09/02/19   Laverle Hobby, PA-C  Physician Assistant  05/13/20   Milus Banister, MD Consulting Physician Gastroenterology  05/24/21   Dian Queen, MD Consulting Physician Obstetrics and Gynecology  05/24/21   Norva Pavlov, MD Consulting Physician Otolaryngology  05/24/21     Objective  Vitals: BP 111/73    Pulse 83    Temp 98.4 F (36.9 C) (Temporal)    Ht '5\' 9"'  (1.753 m)    Wt 164 lb 9.6 oz (74.7 kg)    SpO2 98%    BMI 24.31 kg/m  General:  Well developed, well nourished, no acute distress  Psych:  Alert and oriented,normal mood and affect HEENT:  Normocephalic, atraumatic, non-icteric sclera, PERRL, oropharynx is without mass or exudate, supple neck without adenopathy, mass or thyromegaly Cardiovascular:  RRR without gallop, rub or murmur, nondisplaced PMI Respiratory:  Good breath sounds bilaterally, CTAB with normal respiratory effort Gastrointestinal: normal bowel sounds, soft, non-tender, no noted masses. No HSM  Commons side effects, risks, benefits, and alternatives for medications and treatment plan prescribed today were discussed, and the patient expressed understanding of the given instructions. Patient is instructed to call or message via MyChart if he/she has any questions or concerns regarding our treatment plan. No barriers to understanding were identified. We discussed Red Flag symptoms and signs in detail.  Patient expressed understanding regarding what to do in case of urgent or emergency type symptoms.  Medication list was reconciled, printed and provided to the patient in AVS. Patient instructions and summary information was reviewed with the patient as documented in the AVS. This note was prepared with assistance of Dragon voice recognition software. Occasional wrong-word or sound-a-like substitutions may have occurred due to the inherent limitations of voice recognition software

## 2021-05-24 NOTE — Patient Instructions (Signed)
Please return in 6 months for your annual complete physical; please come fasting.  ? ?We will contact you regarding your test results and I will send in the appropriate medication.  ? ?It is good seeing you again! ? ?If you have any questions or concerns, please don't hesitate to send me a message via MyChart or call the office at [phone]. Thank you for visiting with [place] today! It's our pleasure caring for you.  ?

## 2021-05-26 ENCOUNTER — Telehealth: Payer: Self-pay | Admitting: Family Medicine

## 2021-05-26 LAB — CERVICOVAGINAL ANCILLARY ONLY
Bacterial Vaginitis (gardnerella): POSITIVE — AB
Candida Glabrata: NEGATIVE
Candida Vaginitis: POSITIVE — AB
Chlamydia: NEGATIVE
Comment: NEGATIVE
Comment: NEGATIVE
Comment: NEGATIVE
Comment: NEGATIVE
Comment: NORMAL
Neisseria Gonorrhea: NEGATIVE

## 2021-05-26 MED ORDER — FLUCONAZOLE 150 MG PO TABS: ORAL_TABLET | ORAL | 0 refills | Status: DC

## 2021-05-26 MED ORDER — METRONIDAZOLE 500 MG PO TABS: 500.0000 mg | ORAL_TABLET | Freq: Two times a day (BID) | ORAL | 0 refills | Status: AC

## 2021-05-26 NOTE — Telephone Encounter (Signed)
Pt calling Dr Mardelle Matte for Swab results - Would like a call back -  ?

## 2021-05-26 NOTE — Telephone Encounter (Signed)
Pt will be travelling out of town tonight, and would like for medication to be sent in possibly before leaving. If not, is it possible for medication to be sent to [place]?  ?

## 2021-05-26 NOTE — Addendum Note (Signed)
Addended by: Billey Chang on: 05/26/2021 04:56 PM ? ? Modules accepted: Orders ? ?

## 2021-06-14 ENCOUNTER — Telehealth: Payer: Self-pay | Admitting: Family Medicine

## 2021-06-14 NOTE — Telephone Encounter (Signed)
Pt states she has taken all the medication from her previous appt on 3/18. Unfortunately the symptoms have returned and is asking for another refill. Or if there is something stronger. I advised an office visit but she is wanting to hear from Dr Mardelle Matte first.  ?

## 2021-06-15 ENCOUNTER — Other Ambulatory Visit: Payer: Self-pay | Admitting: Nurse Practitioner

## 2021-06-15 DIAGNOSIS — F331 Major depressive disorder, recurrent, moderate: Secondary | ICD-10-CM | POA: Diagnosis not present

## 2021-06-15 NOTE — Telephone Encounter (Signed)
Pt says that she is experiencing more burning, and having a hard time urinating. Does she need an appointment? ?

## 2021-06-15 NOTE — Telephone Encounter (Signed)
Please schedule appointment with the pt. ? ?Thanks!  ?

## 2021-06-16 ENCOUNTER — Ambulatory Visit: Payer: BC Managed Care – PPO | Admitting: Family Medicine

## 2021-06-16 ENCOUNTER — Ambulatory Visit: Payer: BC Managed Care – PPO | Admitting: Gastroenterology

## 2021-06-16 ENCOUNTER — Encounter: Payer: Self-pay | Admitting: Family Medicine

## 2021-06-16 VITALS — BP 102/68 | HR 91 | Temp 98.3°F | Ht 69.0 in | Wt 162.8 lb

## 2021-06-16 DIAGNOSIS — M6289 Other specified disorders of muscle: Secondary | ICD-10-CM

## 2021-06-16 DIAGNOSIS — R102 Pelvic and perineal pain: Secondary | ICD-10-CM

## 2021-06-16 DIAGNOSIS — R300 Dysuria: Secondary | ICD-10-CM | POA: Diagnosis not present

## 2021-06-16 DIAGNOSIS — G8929 Other chronic pain: Secondary | ICD-10-CM

## 2021-06-16 DIAGNOSIS — K588 Other irritable bowel syndrome: Secondary | ICD-10-CM

## 2021-06-16 LAB — POCT URINALYSIS DIPSTICK
Bilirubin, UA: NEGATIVE
Blood, UA: NEGATIVE
Glucose, UA: NEGATIVE
Ketones, UA: POSITIVE
Leukocytes, UA: NEGATIVE
Nitrite, UA: NEGATIVE
Protein, UA: POSITIVE — AB
Spec Grav, UA: 1.02 (ref 1.010–1.025)
Urobilinogen, UA: 0.2 E.U./dL
pH, UA: 5.5 (ref 5.0–8.0)

## 2021-06-16 NOTE — Progress Notes (Signed)
? ?Subjective  ? ?CC:  ?Chief Complaint  ?Patient presents with  ? Dysuria  ?  Recurring symptoms started about 2 weeks ago.   ? Back Pain  ? Pelvic Pain  ? ? ?HPI: Monica Jackson is a 31 y.o. female who presents to the office today to address the problems listed above in the chief complaint. ?Patient long history of chronic pelvic pain.  Also with IBS.  Also with chronic intermittent dysuria and pelvic floor dysfunction.  She reports 2-week history of constant burning pelvic pain also with dysuria and lower abdominal pain.  Pain is moderate to severe.  Using over-the-counter antispasmodics without relief.  No gross hematuria.  Recently was treated for BV and yeast vaginitis.  Both symptoms resolved with medications.  She takes Levsin for IBS.  That is active as well.  She is quite uncomfortable.  She is very frustrated.  She has an appointment with her GYN next week.  She does have an IUD in place and wonders if that is contributing to her pain.  She has seen GI in the past.  She has not been seen by urology recently.  The urology referral was reportedly done after she had urinary tract infection symptoms without positive cultures. ? ?Assessment  ?1. Dysuria   ?2. Chronic pelvic pain in female   ?3. Pelvic floor dysfunction   ?4. Other irritable bowel syndrome   ? ?  ?Plan  ?Dysuria with chronic pelvic pain: Unclear etiology.  Benign abdomen but diffusely tender.  Await urine microscopy and urine culture.  She does have an appointment with GYN for further GYN evaluation.  Most likely will need to see urology again.  We will ensure there is no acute cystitis today.  Patient understands and agrees with care plan. ?IBS is very active.  She is very stressed. ? ?Follow up: As needed ?Orders Placed This Encounter  ?Procedures  ? Urine Culture  ? Urine Microscopic Only  ? POCT Urinalysis Dipstick  ? ?No orders of the defined types were placed in this encounter. ? ?  ? ?I reviewed the patients updated PMH, FH, and SocHx.   ?  ?Patient Active Problem List  ? Diagnosis Date Noted  ? PTSD (post-traumatic stress disorder) 03/15/2018  ?  Priority: High  ? History of seizure 07/13/2013  ?  Priority: High  ? Major depression, recurrent, chronic (HCC) 07/13/2013  ?  Priority: High  ? IUD (intrauterine device) in place 05/24/2021  ?  Priority: Medium   ? IBS (irritable bowel syndrome) 11/12/2019  ?  Priority: Medium   ? Pelvic floor dysfunction 08/08/2019  ?  Priority: Medium   ? Deviated nasal septum 04/09/2019  ?  Priority: Medium   ? Chronic rhinitis 04/09/2019  ?  Priority: Medium   ? Dysfunctional voiding of urine 08/01/2016  ?  Priority: Medium   ? History of loop electrical excision procedure (LEEP) 08/16/2013  ?  Priority: Medium   ? Migraine without aura 03/21/2012  ?  Priority: Medium   ? 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  Priority: Low  ? Gastritis without bleeding 05/24/2021  ? ?Current Meds  ?Medication Sig  ? clonazePAM (KLONOPIN) 0.5 MG tablet Take 0.5 mg by mouth 2 (two) times daily.  ? desvenlafaxine (PRISTIQ) 100 MG 24 hr tablet Take 50 mg by mouth.  ? fluticasone (FLONASE) 50 MCG/ACT nasal spray 2 sprays by Each Nare route daily.  ? hyoscyamine (LEVSIN SL) 0.125 MG SL tablet PLACE 1 TABLET (  0.125 MG TOTAL) UNDER THE TONGUE 3 (THREE) TIMES DAILY BEFORE MEALS.  ? lamoTRIgine (LAMICTAL) 25 MG tablet Take 25 mg by mouth daily.  ? levonorgestrel (MIRENA) 20 MCG/24HR IUD 1 each by Intrauterine route once.  ? Methen-Hyosc-Meth Blue-Na Phos (ME/NAPHOS/MB/HYO1) 81.6 MG TABS Take 1 tablet by mouth 3 (three) times daily.  ? omeprazole (PRILOSEC) 20 MG capsule TAKE 1 CAPSULE 30 MINUTES BEFORE BREAKFAST EVERY DAY.  ? ondansetron (ZOFRAN) 4 MG tablet Take 1 tablet (4 mg total) by mouth every 8 (eight) hours as needed for nausea or vomiting.  ? prazosin (MINIPRESS) 1 MG capsule TAKE 1 CAPSULE BY MOUTH EVERYDAY AT BEDTIME  ? SUMAtriptan (IMITREX) 50 MG tablet Take 1 tablet (50 mg total) by mouth every 2 (two) hours as needed for  migraine. Take 1 tablet at onset of headache, may repeat in 2 hours  ? topiramate (TOPAMAX) 100 MG tablet TAKE 1 TABLET BY MOUTH TWICE A DAY  ? traZODone (DESYREL) 150 MG tablet Take 150 mg by mouth at bedtime.  ? ? ?Review of Systems: ?Cardiovascular: negative for chest pain ?Respiratory: negative for SOB or persistent cough ?Gastrointestinal: negative for abdominal pain ?Constitutional: Negative for fever malaise or anorexia ? ?Objective  ?Vitals: BP 102/68   Pulse 91   Temp 98.3 ?F (36.8 ?C) (Temporal)   Ht 5\' 9"  (1.753 m)   Wt 162 lb 12.8 oz (73.8 kg)   SpO2 99%   BMI 24.04 kg/m?  ?General: no acute distress but appears uncomfortable ?Psych:  Alert and oriented, normal mood and affect ?Cardiovascular:  RRR without murmur or gallop. no peripheral edema ?Respiratory:  Good breath sounds bilaterally, CTAB with normal respiratory effort ?Gastrointestinal: soft, flat abdomen, normal active bowel sounds, no palpable masses, no hepatosplenomegaly, no appreciated hernias, NO CVAT, mild suprapubic ttp w/o rebound or guarding, diffuse abdominal tenderness without masses or guarding ?Skin:  Warm, no rashes ?Neurologic:   Mental status is normal. normal gait ?Office Visit on 06/16/2021  ?Component Date Value Ref Range Status  ? Color, UA 06/16/2021 blue   Final  ? Clarity, UA 06/16/2021 clear   Final  ? Glucose, UA 06/16/2021 Negative  Negative Final  ? Bilirubin, UA 06/16/2021 neg   Final  ? Ketones, UA 06/16/2021 positive   Final  ? Spec Grav, UA 06/16/2021 1.020  1.010 - 1.025 Final  ? Blood, UA 06/16/2021 neg   Final  ? pH, UA 06/16/2021 5.5  5.0 - 8.0 Final  ? Protein, UA 06/16/2021 Positive (A)  Negative Final  ? Urobilinogen, UA 06/16/2021 0.2  0.2 or 1.0 E.U./dL Final  ? Nitrite, UA 08/16/2021 neg   Final  ? Leukocytes, UA 06/16/2021 Negative  Negative Final  ?Patient was taking methylene blue thus dipstick is inaccurate. ? ?Commons side effects, risks, benefits, and alternatives for medications and treatment  plan prescribed today were discussed, and the patient expressed understanding of the given instructions. Patient is instructed to call or message via MyChart if he/she has any questions or concerns regarding our treatment plan. No barriers to understanding were identified. We discussed Red Flag symptoms and signs in detail. Patient expressed understanding regarding what to do in case of urgent or emergency type symptoms.  ?Medication list was reconciled, printed and provided to the patient in AVS. Patient instructions and summary information was reviewed with the patient as documented in the AVS. ?This note was prepared with assistance of 08/16/2021. Occasional wrong-word or sound-a-like substitutions may have occurred due to the inherent  limitations of voice recognition software ? ? ? ?

## 2021-06-16 NOTE — Patient Instructions (Signed)
Please follow up if symptoms do not improve or as needed.   ? ?I will let you know if your urine is infected.  ?You will need follow up with your urologist if your symptoms persist.  ?

## 2021-06-17 LAB — URINE CULTURE
MICRO NUMBER:: 13226066
SPECIMEN QUALITY:: ADEQUATE

## 2021-06-17 LAB — URINALYSIS, MICROSCOPIC ONLY

## 2021-06-23 DIAGNOSIS — F331 Major depressive disorder, recurrent, moderate: Secondary | ICD-10-CM | POA: Diagnosis not present

## 2021-06-23 DIAGNOSIS — N3010 Interstitial cystitis (chronic) without hematuria: Secondary | ICD-10-CM | POA: Diagnosis not present

## 2021-06-23 DIAGNOSIS — R102 Pelvic and perineal pain: Secondary | ICD-10-CM | POA: Diagnosis not present

## 2021-07-01 DIAGNOSIS — F331 Major depressive disorder, recurrent, moderate: Secondary | ICD-10-CM | POA: Diagnosis not present

## 2021-07-07 ENCOUNTER — Other Ambulatory Visit: Payer: Self-pay | Admitting: Nurse Practitioner

## 2021-07-14 ENCOUNTER — Other Ambulatory Visit: Payer: Self-pay | Admitting: Family Medicine

## 2021-07-14 DIAGNOSIS — F4312 Post-traumatic stress disorder, chronic: Secondary | ICD-10-CM

## 2021-07-21 ENCOUNTER — Other Ambulatory Visit: Payer: Self-pay | Admitting: Nurse Practitioner

## 2021-07-27 DIAGNOSIS — K580 Irritable bowel syndrome with diarrhea: Secondary | ICD-10-CM | POA: Diagnosis not present

## 2021-07-27 DIAGNOSIS — N3010 Interstitial cystitis (chronic) without hematuria: Secondary | ICD-10-CM | POA: Diagnosis not present

## 2021-08-03 DIAGNOSIS — F3341 Major depressive disorder, recurrent, in partial remission: Secondary | ICD-10-CM | POA: Diagnosis not present

## 2021-08-03 DIAGNOSIS — F411 Generalized anxiety disorder: Secondary | ICD-10-CM | POA: Diagnosis not present

## 2021-08-03 DIAGNOSIS — F4312 Post-traumatic stress disorder, chronic: Secondary | ICD-10-CM | POA: Diagnosis not present

## 2021-10-11 DIAGNOSIS — R350 Frequency of micturition: Secondary | ICD-10-CM | POA: Diagnosis not present

## 2021-10-11 DIAGNOSIS — R569 Unspecified convulsions: Secondary | ICD-10-CM | POA: Diagnosis not present

## 2021-10-11 DIAGNOSIS — R3915 Urgency of urination: Secondary | ICD-10-CM | POA: Diagnosis not present

## 2021-10-11 DIAGNOSIS — M7989 Other specified soft tissue disorders: Secondary | ICD-10-CM | POA: Diagnosis not present

## 2021-10-11 DIAGNOSIS — N3010 Interstitial cystitis (chronic) without hematuria: Secondary | ICD-10-CM | POA: Diagnosis not present

## 2021-10-11 DIAGNOSIS — M7918 Myalgia, other site: Secondary | ICD-10-CM | POA: Diagnosis not present

## 2021-11-02 DIAGNOSIS — F411 Generalized anxiety disorder: Secondary | ICD-10-CM | POA: Diagnosis not present

## 2021-11-02 DIAGNOSIS — F4312 Post-traumatic stress disorder, chronic: Secondary | ICD-10-CM | POA: Diagnosis not present

## 2021-11-02 DIAGNOSIS — F3341 Major depressive disorder, recurrent, in partial remission: Secondary | ICD-10-CM | POA: Diagnosis not present

## 2021-12-06 ENCOUNTER — Encounter: Payer: Self-pay | Admitting: *Deleted

## 2022-01-11 DIAGNOSIS — F3341 Major depressive disorder, recurrent, in partial remission: Secondary | ICD-10-CM | POA: Diagnosis not present

## 2022-01-11 DIAGNOSIS — F4312 Post-traumatic stress disorder, chronic: Secondary | ICD-10-CM | POA: Diagnosis not present

## 2022-01-11 DIAGNOSIS — F411 Generalized anxiety disorder: Secondary | ICD-10-CM | POA: Diagnosis not present

## 2022-02-15 DIAGNOSIS — F3341 Major depressive disorder, recurrent, in partial remission: Secondary | ICD-10-CM | POA: Diagnosis not present

## 2022-02-15 DIAGNOSIS — F4312 Post-traumatic stress disorder, chronic: Secondary | ICD-10-CM | POA: Diagnosis not present

## 2022-02-15 DIAGNOSIS — F411 Generalized anxiety disorder: Secondary | ICD-10-CM | POA: Diagnosis not present

## 2022-02-23 ENCOUNTER — Ambulatory Visit: Payer: BC Managed Care – PPO | Admitting: Family Medicine

## 2022-02-23 ENCOUNTER — Other Ambulatory Visit (HOSPITAL_COMMUNITY)
Admission: RE | Admit: 2022-02-23 | Discharge: 2022-02-23 | Disposition: A | Discharge status: Home / Self Care | Payer: BC Managed Care – PPO | Source: Ambulatory Visit | Attending: Family Medicine | Admitting: Family Medicine

## 2022-02-23 ENCOUNTER — Encounter: Payer: Self-pay | Admitting: Family Medicine

## 2022-02-23 VITALS — BP 98/62 | HR 89 | Temp 98.2°F | Ht 69.0 in | Wt 164.0 lb

## 2022-02-23 DIAGNOSIS — R350 Frequency of micturition: Secondary | ICD-10-CM

## 2022-02-23 DIAGNOSIS — N898 Other specified noninflammatory disorders of vagina: Secondary | ICD-10-CM | POA: Insufficient documentation

## 2022-02-23 DIAGNOSIS — N3010 Interstitial cystitis (chronic) without hematuria: Secondary | ICD-10-CM

## 2022-02-23 DIAGNOSIS — R81 Glycosuria: Secondary | ICD-10-CM

## 2022-02-23 LAB — POC URINALSYSI DIPSTICK (AUTOMATED)
Bilirubin, UA: POSITIVE
Blood, UA: NEGATIVE
Glucose, UA: POSITIVE — AB
Ketones, UA: POSITIVE
Nitrite, UA: POSITIVE
Protein, UA: POSITIVE — AB
Spec Grav, UA: >=1.03 — AB (ref 1.010–1.025)
Urobilinogen, UA: >=8 E.U./dL — AB
pH, UA: 5 (ref 5.0–8.0)

## 2022-02-23 MED ORDER — CEPHALEXIN 500 MG PO CAPS: 500.0000 mg | ORAL_CAPSULE | Freq: Three times a day (TID) | ORAL | 0 refills | Status: AC

## 2022-02-23 NOTE — Progress Notes (Signed)
Phone [phone] In person visit   Subjective:   Monica Jackson is a 31 y.o. year old very pleasant female patient who presents for/with See problem oriented charting Chief Complaint  Patient presents with   Vaginal Discharge    Pt c/o discharge for a week, denies itching or burning.   Urinary Frequency    Pt states she has intersticial cystitis and her frequency and pain started this morning. She has taken an AZO already.   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 Per Dr. Logan Bores     cephALEXin (KEFLEX) 500 MG capsule Take 1 capsule (500 mg total) by mouth 3 (three) times daily for 7 days. 21 capsule 0   clonazePAM (KLONOPIN) 0.5 MG tablet Take 0.5 mg by mouth 2 (two) times daily.     desvenlafaxine (PRISTIQ) 100 MG 24 hr tablet Take 50 mg by mouth.     fluticasone (FLONASE) 50 MCG/ACT nasal spray 2 sprays by Each Nare route daily.     hyoscyamine (LEVSIN SL) 0.125 MG SL tablet PLACE 1 TABLET UNDER THE TONGUE 3 TIMES DAILY BEFORE MEALS. 270 tablet 1   lamoTRIgine (LAMICTAL) 25 MG tablet Take 25 mg by mouth daily.     levonorgestrel (MIRENA) 20 MCG/24HR IUD 1 each by Intrauterine route  once.     omeprazole (PRILOSEC) 20 MG capsule TAKE 1 CAPSULE 30 MINUTES BEFORE BREAKFAST EVERY DAY. 90 capsule 1   ondansetron (ZOFRAN) 4 MG tablet Take 1 tablet (4 mg total) by mouth every 8 (eight) hours as needed for nausea or vomiting. 30 tablet 0   prazosin (MINIPRESS) 1 MG capsule TAKE 1 CAPSULE BY MOUTH EVERYDAY AT BEDTIME     SUMAtriptan (IMITREX) 50 MG tablet Take 1 tablet (50 mg total) by mouth every 2 (two) hours as needed for migraine. Take 1 tablet at onset of headache, may repeat in 2 hours 10 tablet 6   topiramate (TOPAMAX) 100 MG tablet TAKE 1 TABLET BY MOUTH TWICE A DAY 180 tablet 0   traZODone (DESYREL) 150 MG tablet Take 150 mg by mouth at bedtime.     ELMIRON 100 MG capsule Take 100 mg by mouth 3 (three) times daily. Per Dr. Logan Bores     hydrOXYzine (ATARAX) 25 MG tablet Per Dr. Logan Bores     No current facility-administered medications for this visit.     Objective:  BP 98/62   Pulse 89   Temp 98.2 F (36.8 C)   Ht 5\' 9"  (1.753 m)   Wt 164 lb (74.4 kg)   SpO2 98%   BMI 24.22 kg/m  Gen: NAD, resting comfortably CV: RRR no murmurs rubs or gallops Lungs: CTAB no crackles, wheeze, rhonchi Abdomen: soft/tender in lower abdomen/nondistended/No rebound or guarding.  Pelvic:  cervix normal in appearance, external genitalia normal, no adnexal masses or tenderness, no cervical motion tenderness, positive findings: vaginal discharge- slight greenish tint, uterus normal size, shape, and consistency, and vagina normal without discharge   Please note patient was on Azo at time of urinalysis-team to input      Assessment and Plan   #Concern for UTI-patient with history of interstitial cystitis S: Patients symptoms started this morning.  She has taken Azo.  -on Elmiron, hydroxyzine 25 to 50 mg at night as needed, and amitriptyline 150 per night per Dr. Logan Bores per baseline per urology- sees Dr. Marcelyn Bruins  Complains of dysuria: yes but also lower abdominal pain 6-7/10 (much  higher than baseline); polyuria: yes; nocturia: unchanged; urgency: yes.  Symptoms are sudden onset this morning.  ROS- no fever, chills, nausea, vomiting, flank pain. No blood in urine.  A/P: UA concerning for UTI but did have azo which can alter results. Will get culture. Empiric treatment with: keflex  Does appear possible UTI is enhancing interstitial cystitis symptoms-hoping for improvement with treatment with antibiotic-continue current medications as listed above Patient to follow up if new or worsening symptoms or failure to improve.  -Also noted that she is on both amitriptyline and desvenlafaxine-she states her physician who is prescribing the desvenlafaxine is aware of the combination-we discussed serotonin syndrome risk  -we will reach out once we have vaginal testing back but I am suspicious for bacterial vaginitis  # Vaginal discharge S: Reports vaginal discharge for a week- perhaps mild odor but higher volume than usual.  No itching or burning. No whitish discharge.   - long term boyfriend- not concerned for STDS- still gets tested.  A/P: Vaginal discharge-will attempt to determine etiology with testing for gonorrhea/chlamydia/trichomonas (doubt STD), bacterial vaginitis, yeast infection  #Glucosuria- thankfully blood sugar only 95- did have AZO this urine  Recommended follow up: Return for as needed for new, worsening, persistent symptoms.  Lab/Order associations:   ICD-10-CM   1. Urinary frequency  R35.0 POCT Urinalysis Dipstick (Automated)    Urine Culture    CANCELED: Urine Culture    2. Glucosuria  R81     3. Vaginal discharge  N89.8 Cervicovaginal ancillary only    4. Interstitial cystitis  N30.10       Meds ordered this encounter  Medications   cephALEXin (KEFLEX) 500 MG capsule    Sig: Take 1 capsule (500 mg total) by mouth 3 (three) times daily for 7 days.    Dispense:  21 capsule    Refill:  0    Return precautions advised.  Tana Conch, MD

## 2022-02-23 NOTE — Patient Instructions (Addendum)
UA concerning for UTI but did have azo which can alter results. Will get culture. Empiric treatment with: keflex Patient to follow up if new or worsening symptoms or failure to improve.  -we will reach out once we have vaginal testing back  Recommended follow up: Return for as needed for new, worsening, persistent symptoms.

## 2022-02-24 ENCOUNTER — Encounter: Payer: Self-pay | Admitting: *Deleted

## 2022-02-24 ENCOUNTER — Other Ambulatory Visit: Payer: Self-pay | Admitting: Family Medicine

## 2022-02-24 LAB — CERVICOVAGINAL ANCILLARY ONLY
Bacterial Vaginitis (gardnerella): POSITIVE — AB
Candida Glabrata: NEGATIVE
Candida Vaginitis: NEGATIVE
Chlamydia: NEGATIVE
Comment: NEGATIVE
Comment: NEGATIVE
Comment: NEGATIVE
Comment: NEGATIVE
Comment: NEGATIVE
Comment: NORMAL
Neisseria Gonorrhea: NEGATIVE
Trichomonas: NEGATIVE

## 2022-02-24 LAB — URINE CULTURE
MICRO NUMBER:: 14310016
Result:: NO GROWTH
SPECIMEN QUALITY:: ADEQUATE

## 2022-02-24 MED ORDER — METRONIDAZOLE 500 MG PO TABS: 500.0000 mg | ORAL_TABLET | Freq: Two times a day (BID) | ORAL | 0 refills | Status: DC

## 2022-03-23 DIAGNOSIS — F411 Generalized anxiety disorder: Secondary | ICD-10-CM | POA: Diagnosis not present

## 2022-03-23 DIAGNOSIS — F3341 Major depressive disorder, recurrent, in partial remission: Secondary | ICD-10-CM | POA: Diagnosis not present

## 2022-03-23 DIAGNOSIS — F4312 Post-traumatic stress disorder, chronic: Secondary | ICD-10-CM | POA: Diagnosis not present

## 2022-03-29 DIAGNOSIS — N898 Other specified noninflammatory disorders of vagina: Secondary | ICD-10-CM | POA: Diagnosis not present

## 2022-03-29 DIAGNOSIS — N760 Acute vaginitis: Secondary | ICD-10-CM | POA: Diagnosis not present

## 2022-03-29 DIAGNOSIS — Z113 Encounter for screening for infections with a predominantly sexual mode of transmission: Secondary | ICD-10-CM | POA: Diagnosis not present

## 2022-05-31 DIAGNOSIS — Z30431 Encounter for routine checking of intrauterine contraceptive device: Secondary | ICD-10-CM | POA: Diagnosis not present

## 2022-05-31 DIAGNOSIS — Z6825 Body mass index (BMI) 25.0-25.9, adult: Secondary | ICD-10-CM | POA: Diagnosis not present

## 2022-05-31 DIAGNOSIS — Z30432 Encounter for removal of intrauterine contraceptive device: Secondary | ICD-10-CM | POA: Diagnosis not present

## 2022-05-31 DIAGNOSIS — N760 Acute vaginitis: Secondary | ICD-10-CM | POA: Diagnosis not present

## 2022-05-31 DIAGNOSIS — Z01419 Encounter for gynecological examination (general) (routine) without abnormal findings: Secondary | ICD-10-CM | POA: Diagnosis not present

## 2022-05-31 DIAGNOSIS — Z124 Encounter for screening for malignant neoplasm of cervix: Secondary | ICD-10-CM | POA: Diagnosis not present

## 2022-06-01 LAB — HM PAP SMEAR

## 2022-06-27 DIAGNOSIS — K602 Anal fissure, unspecified: Secondary | ICD-10-CM | POA: Diagnosis not present

## 2022-06-28 DIAGNOSIS — F411 Generalized anxiety disorder: Secondary | ICD-10-CM | POA: Diagnosis not present

## 2022-06-28 DIAGNOSIS — F4312 Post-traumatic stress disorder, chronic: Secondary | ICD-10-CM | POA: Diagnosis not present

## 2022-06-28 DIAGNOSIS — F3341 Major depressive disorder, recurrent, in partial remission: Secondary | ICD-10-CM | POA: Diagnosis not present

## 2022-07-13 HISTORY — PX: APPENDECTOMY: SHX54

## 2022-07-31 DIAGNOSIS — R197 Diarrhea, unspecified: Secondary | ICD-10-CM | POA: Diagnosis not present

## 2022-07-31 DIAGNOSIS — F29 Unspecified psychosis not due to a substance or known physiological condition: Secondary | ICD-10-CM | POA: Diagnosis not present

## 2022-07-31 DIAGNOSIS — R112 Nausea with vomiting, unspecified: Secondary | ICD-10-CM | POA: Diagnosis not present

## 2022-07-31 DIAGNOSIS — K37 Unspecified appendicitis: Secondary | ICD-10-CM | POA: Diagnosis not present

## 2022-07-31 DIAGNOSIS — R1031 Right lower quadrant pain: Secondary | ICD-10-CM | POA: Diagnosis not present

## 2022-07-31 DIAGNOSIS — R1084 Generalized abdominal pain: Secondary | ICD-10-CM | POA: Diagnosis not present

## 2022-07-31 DIAGNOSIS — K3580 Unspecified acute appendicitis: Secondary | ICD-10-CM | POA: Diagnosis not present

## 2022-07-31 DIAGNOSIS — N8311 Corpus luteum cyst of right ovary: Secondary | ICD-10-CM | POA: Diagnosis not present

## 2022-07-31 DIAGNOSIS — K353 Acute appendicitis with localized peritonitis, without perforation or gangrene: Secondary | ICD-10-CM | POA: Diagnosis not present

## 2022-07-31 DIAGNOSIS — R000 Tachycardia, unspecified: Secondary | ICD-10-CM | POA: Diagnosis not present

## 2022-07-31 DIAGNOSIS — R1030 Lower abdominal pain, unspecified: Secondary | ICD-10-CM | POA: Diagnosis not present

## 2022-08-19 ENCOUNTER — Encounter: Payer: Self-pay | Admitting: Family Medicine

## 2022-08-19 ENCOUNTER — Ambulatory Visit: Payer: BC Managed Care – PPO | Admitting: Family Medicine

## 2022-08-19 VITALS — BP 96/60 | HR 126 | Temp 98.2°F | Ht 69.0 in | Wt 182.4 lb

## 2022-08-19 DIAGNOSIS — R000 Tachycardia, unspecified: Secondary | ICD-10-CM | POA: Diagnosis not present

## 2022-08-19 DIAGNOSIS — F339 Major depressive disorder, recurrent, unspecified: Secondary | ICD-10-CM | POA: Diagnosis not present

## 2022-08-19 DIAGNOSIS — Z9049 Acquired absence of other specified parts of digestive tract: Secondary | ICD-10-CM

## 2022-08-19 NOTE — Progress Notes (Signed)
Subjective  CC:  Chief Complaint  Patient presents with   appendectomy    Pt stated that she was in [place] about 2 weeks ago and had an emergency appendectomy    HPI: Monica Jackson is a 32 y.o. female who presents to the office today to address the problems listed above in the chief complaint. 32 year old female s/p appendectomy done at [facility] [place] on Jul 31, 2022.  I have reviewed outside records including hospital discharge summary, lab work, abdominal pelvic CT scan and discharge summary.  Patient underwent laparoscopic appendectomy for uncomplicated appendicitis.  She was discharged after surgery.  Treated with oxycodone for pain short-term.  She reports she is recovering well.  Minimal soreness remains.  Normal diet.  Normal bowel movements. I reviewed chart, treated by psychiatry for mood disorder which she reports is well-controlled. Health maintenance: Had female wellness exam in March, called for records.  Pap smear done and reportedly normal.  IUD was removed.  She is due for a complete physical with me at her convenience.  Assessment  1. Status post laparoscopic appendectomy   2. Major depression, recurrent, chronic (HCC) Chronic  3. Tachycardia      Plan  Appendicitis status post appendectomy: No complications.  Routine recovery instructions delivered Mood is well-controlled, reviewed medications Tachycardia: Patient reports chronic.  Recommend good hydration.  Has regular menstrual cycles.  No palpitations.  Recommend return for complete physical and blood work at her convenience, check TSH and CBC at that time.  Follow up: 12 pleat physical Visit date not found  No orders of the defined types were placed in this encounter.  No orders of the defined types were placed in this encounter.     I reviewed the patients updated PMH, FH, and SocHx.    Patient Active Problem List   Diagnosis Date Noted   Interstitial cystitis 02/23/2022    Priority: High    PTSD (post-traumatic stress disorder) 03/15/2018    Priority: High   History of seizure 07/13/2013    Priority: High   Major depression, recurrent, chronic (HCC) 07/13/2013    Priority: High   Gastritis without bleeding 05/24/2021    Priority: Medium    IBS (irritable bowel syndrome) 11/12/2019    Priority: Medium    Pelvic floor dysfunction 08/08/2019    Priority: Medium    Deviated nasal septum 04/09/2019    Priority: Medium    Chronic rhinitis 04/09/2019    Priority: Medium    Dysfunctional voiding of urine 08/01/2016    Priority: Medium    History of loop electrical excision procedure (LEEP) 08/16/2013    Priority: Medium    Migraine without aura 03/21/2012    Priority: Medium    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 Per Dr. Logan Bores   clonazePAM (KLONOPIN) 0.5 MG tablet Take 0.5 mg by mouth 2 (two) times daily.   desvenlafaxine (PRISTIQ) 100 MG 24 hr tablet Take 50 mg by mouth.   ELMIRON 100 MG capsule Take 100 mg by mouth 3 (three) times daily. Per Dr. Logan Bores   hydrOXYzine (ATARAX) 25 MG tablet Per Dr. Logan Bores   lamoTRIgine (LAMICTAL) 25 MG tablet Take 25 mg by mouth daily.   ondansetron (ZOFRAN) 4 MG tablet Take 1 tablet (4 mg total) by mouth every 8 (eight) hours as needed for nausea or vomiting.  prazosin (MINIPRESS) 1 MG capsule TAKE 1 CAPSULE BY MOUTH EVERYDAY AT BEDTIME   SUMAtriptan (IMITREX) 50 MG tablet Take 1 tablet (50 mg total) by mouth every 2 (two) hours as needed for migraine. Take 1 tablet at onset of headache, may repeat in 2 hours   topiramate (TOPAMAX) 100 MG tablet TAKE 1 TABLET BY MOUTH TWICE A DAY    Allergies: Patient is allergic to sulfa antibiotics. Family History: Patient family history includes Cancer in her paternal grandmother; Colon polyps in her paternal grandfather; Diabetes in her maternal grandfather, maternal grandmother,  and mother; Heart disease in her maternal grandfather; Hyperlipidemia in her father; Hypertension in her paternal grandfather. Social History:  Patient  reports that she has quit smoking. She has never used smokeless tobacco. She reports current alcohol use. She reports that she does not currently use drugs.  Review of Systems: Constitutional: Negative for fever malaise or anorexia Cardiovascular: negative for chest pain Respiratory: negative for SOB or persistent cough Gastrointestinal: negative for abdominal pain  Objective  Vitals: BP 96/60   Pulse (!) 126   Temp 98.2 F (36.8 C)   Ht 5\' 9"  (1.753 m)   Wt 182 lb 6.4 oz (82.7 kg)   SpO2 99%   BMI 26.94 kg/m  General: no acute distress , A&Ox3 HEENT: PEERL, conjunctiva normal, neck is supple Cardiovascular:  RRR without murmur or gallop.  Respiratory:  Good breath sounds bilaterally, CTAB with normal respiratory effort Abdomen: Normal bowel sounds, soft, mild distention, well-healed laparoscopic surgical scars.  Nontender  Commons side effects, risks, benefits, and alternatives for medications and treatment plan prescribed today were discussed, and the patient expressed understanding of the given instructions. Patient is instructed to call or message via MyChart if he/she has any questions or concerns regarding our treatment plan. No barriers to understanding were identified. We discussed Red Flag symptoms and signs in detail. Patient expressed understanding regarding what to do in case of urgent or emergency type symptoms.  Medication list was reconciled, printed and provided to the patient in AVS. Patient instructions and summary information was reviewed with the patient as documented in the AVS. This note was prepared with assistance of Dragon voice recognition software. Occasional wrong-word or sound-a-like substitutions may have occurred due to the inherent limitations of voice recognition software

## 2022-08-19 NOTE — Patient Instructions (Signed)
Please return for your annual complete physical; please come fasting.   If you have any questions or concerns, please don't hesitate to send me a message via MyChart or call the office at [phone]. Thank you for visiting with us today! It's our pleasure caring for you.   

## 2022-08-22 ENCOUNTER — Other Ambulatory Visit (HOSPITAL_COMMUNITY)
Admission: RE | Admit: 2022-08-22 | Discharge: 2022-08-22 | Disposition: A | Discharge status: Home / Self Care | Payer: BC Managed Care – PPO | Source: Ambulatory Visit | Attending: Family Medicine | Admitting: Family Medicine

## 2022-08-22 ENCOUNTER — Ambulatory Visit: Payer: BC Managed Care – PPO | Admitting: Family Medicine

## 2022-08-22 VITALS — BP 114/62 | HR 114 | Temp 98.4°F | Ht 69.0 in | Wt 181.4 lb

## 2022-08-22 DIAGNOSIS — N898 Other specified noninflammatory disorders of vagina: Secondary | ICD-10-CM | POA: Diagnosis not present

## 2022-08-22 DIAGNOSIS — N760 Acute vaginitis: Secondary | ICD-10-CM | POA: Insufficient documentation

## 2022-08-22 DIAGNOSIS — N3010 Interstitial cystitis (chronic) without hematuria: Secondary | ICD-10-CM | POA: Diagnosis not present

## 2022-08-22 DIAGNOSIS — B9689 Other specified bacterial agents as the cause of diseases classified elsewhere: Secondary | ICD-10-CM | POA: Insufficient documentation

## 2022-08-22 DIAGNOSIS — R300 Dysuria: Secondary | ICD-10-CM

## 2022-08-22 MED ORDER — METRONIDAZOLE 500 MG PO TABS: 500.0000 mg | ORAL_TABLET | Freq: Two times a day (BID) | ORAL | 0 refills | Status: AC

## 2022-08-22 NOTE — Progress Notes (Signed)
Subjective  CC:  Chief Complaint  Patient presents with   Abdominal Pain    Pt stated that she has been experiencing some abdominal pain for the past 3 days along with vaginal smell, discharge, reddness in the vaginal area, and burning     HPI: Monica Jackson is a 32 y.o. female who presents to the office today to address the problems listed above in the chief complaint. 32 year old with recent appendectomy presents due to 3 days of symptoms consistent with her bacterial vaginosis infections.  She does have a history of recurrent BV.  He had BV in December 2023 and possibly 1 time this year diagnosed at her gynecology office.  She has used boric acid suppositories to help prevent infections.  She reports a vaginal discharge with odor, she also gets some dysuria and suprapubic pelvic pain.  She had diagnosis of interstitial cystitis but does not feel that that is flaring.  No gross hematuria.  No fevers chills or flank pain.  No abdominal pain.  Appetite is good.  Assessment  1. BV (bacterial vaginosis)   2. Interstitial cystitis   3. Dysuria   4. Vaginal discharge      Plan  Vaginal discharge history of recurrent BV: Will treat empirically and await swab.  Exam is benign. Dysuria and interstitial cystitis: Check urine culture and micro just to be sure there is no infection.  She did use AZO today thus, no dipstick ordered.  Follow up: For complete physical   Visit date not found  Orders Placed This Encounter  Procedures   Urine Culture   Urine Microscopic Only   Meds ordered this encounter  Medications   metroNIDAZOLE (FLAGYL) 500 MG tablet    Sig: Take 1 tablet (500 mg total) by mouth 2 (two) times daily for 7 days.    Dispense:  14 tablet    Refill:  0      I reviewed the patients updated PMH, FH, and SocHx.    Patient Active Problem List   Diagnosis Date Noted   Interstitial cystitis 02/23/2022    Priority: High   PTSD (post-traumatic stress disorder) 03/15/2018     Priority: High   History of seizure 07/13/2013    Priority: High   Major depression, recurrent, chronic (HCC) 07/13/2013    Priority: High   Gastritis without bleeding 05/24/2021    Priority: Medium    IBS (irritable bowel syndrome) 11/12/2019    Priority: Medium    Pelvic floor dysfunction 08/08/2019    Priority: Medium    Deviated nasal septum 04/09/2019    Priority: Medium    Chronic rhinitis 04/09/2019    Priority: Medium    Dysfunctional voiding of urine 08/01/2016    Priority: Medium    History of loop electrical excision procedure (LEEP) 08/16/2013    Priority: Medium    Migraine without aura 03/21/2012    Priority: Medium    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 Per Dr. Logan Bores   clonazePAM (KLONOPIN) 0.5 MG tablet Take 0.5 mg by mouth 2 (two) times daily.   desvenlafaxine (PRISTIQ) 100 MG 24 hr tablet Take 50 mg by mouth.   ELMIRON 100 MG capsule Take 100 mg by mouth 3 (three) times daily. Per Dr. Logan Bores   hydrOXYzine (ATARAX) 25 MG tablet Per Dr. Logan Bores   lamoTRIgine (LAMICTAL) 25 MG  tablet Take 25 mg by mouth daily.   metroNIDAZOLE (FLAGYL) 500 MG tablet Take 1 tablet (500 mg total) by mouth 2 (two) times daily for 7 days.   ondansetron (ZOFRAN) 4 MG tablet Take 1 tablet (4 mg total) by mouth every 8 (eight) hours as needed for nausea or vomiting.   prazosin (MINIPRESS) 1 MG capsule TAKE 1 CAPSULE BY MOUTH EVERYDAY AT BEDTIME   SUMAtriptan (IMITREX) 50 MG tablet Take 1 tablet (50 mg total) by mouth every 2 (two) hours as needed for migraine. Take 1 tablet at onset of headache, may repeat in 2 hours   topiramate (TOPAMAX) 100 MG tablet TAKE 1 TABLET BY MOUTH TWICE A DAY    Allergies: Patient is allergic to sulfa antibiotics. Family History: Patient family history includes Cancer in her paternal grandmother; Colon polyps in her paternal  grandfather; Diabetes in her maternal grandfather, maternal grandmother, and mother; Heart disease in her maternal grandfather; Hyperlipidemia in her father; Hypertension in her paternal grandfather. Social History:  Patient  reports that she has quit smoking. She has never used smokeless tobacco. She reports current alcohol use. She reports that she does not currently use drugs.  Review of Systems: Constitutional: Negative for fever malaise or anorexia Cardiovascular: negative for chest pain Respiratory: negative for SOB or persistent cough Gastrointestinal: negative for abdominal pain  Objective  Vitals: BP 114/62   Pulse (!) 114   Temp 98.4 F (36.9 C)   Ht 5\' 9"  (1.753 m)   Wt 181 lb 6.4 oz (82.3 kg)   SpO2 99%   BMI 26.79 kg/m  General: no acute distress , A&Ox3 Abdomen: Soft, nontender, minimal suprapubic tenderness, no rebound or guarding, no CVA tenderness Skin:  Warm, no rashes  Commons side effects, risks, benefits, and alternatives for medications and treatment plan prescribed today were discussed, and the patient expressed understanding of the given instructions. Patient is instructed to call or message via MyChart if he/she has any questions or concerns regarding our treatment plan. No barriers to understanding were identified. We discussed Red Flag symptoms and signs in detail. Patient expressed understanding regarding what to do in case of urgent or emergency type symptoms.  Medication list was reconciled, printed and provided to the patient in AVS. Patient instructions and summary information was reviewed with the patient as documented in the AVS. This note was prepared with assistance of Dragon voice recognition software. Occasional wrong-word or sound-a-like substitutions may have occurred due to the inherent limitations of voice recognition software

## 2022-08-23 LAB — URINALYSIS, MICROSCOPIC ONLY: RBC / HPF: NONE SEEN (ref 0–?)

## 2022-08-23 NOTE — Progress Notes (Signed)
See my chart note.

## 2022-08-24 LAB — CERVICOVAGINAL ANCILLARY ONLY
Bacterial Vaginitis (gardnerella): POSITIVE — AB
Candida Glabrata: NEGATIVE
Candida Vaginitis: NEGATIVE
Chlamydia: NEGATIVE
Comment: NEGATIVE
Comment: NEGATIVE
Comment: NEGATIVE
Comment: NEGATIVE
Comment: NEGATIVE
Comment: NORMAL
Neisseria Gonorrhea: NEGATIVE
Trichomonas: NEGATIVE

## 2022-08-24 LAB — URINE CULTURE
MICRO NUMBER:: 15062113
SPECIMEN QUALITY:: ADEQUATE

## 2022-08-24 NOTE — Progress Notes (Signed)
See my chart note.

## 2022-09-27 DIAGNOSIS — F4312 Post-traumatic stress disorder, chronic: Secondary | ICD-10-CM | POA: Diagnosis not present

## 2022-09-27 DIAGNOSIS — F3341 Major depressive disorder, recurrent, in partial remission: Secondary | ICD-10-CM | POA: Diagnosis not present

## 2022-09-27 DIAGNOSIS — F411 Generalized anxiety disorder: Secondary | ICD-10-CM | POA: Diagnosis not present

## 2022-11-08 DIAGNOSIS — F411 Generalized anxiety disorder: Secondary | ICD-10-CM | POA: Diagnosis not present

## 2022-11-08 DIAGNOSIS — F3341 Major depressive disorder, recurrent, in partial remission: Secondary | ICD-10-CM | POA: Diagnosis not present

## 2022-11-08 DIAGNOSIS — F4312 Post-traumatic stress disorder, chronic: Secondary | ICD-10-CM | POA: Diagnosis not present

## 2022-12-29 DIAGNOSIS — F3341 Major depressive disorder, recurrent, in partial remission: Secondary | ICD-10-CM | POA: Diagnosis not present

## 2022-12-29 DIAGNOSIS — F411 Generalized anxiety disorder: Secondary | ICD-10-CM | POA: Diagnosis not present

## 2022-12-29 DIAGNOSIS — F5105 Insomnia due to other mental disorder: Secondary | ICD-10-CM | POA: Diagnosis not present

## 2022-12-29 DIAGNOSIS — F4312 Post-traumatic stress disorder, chronic: Secondary | ICD-10-CM | POA: Diagnosis not present

## 2023-01-03 ENCOUNTER — Ambulatory Visit: Payer: BC Managed Care – PPO | Admitting: Family Medicine

## 2023-01-03 ENCOUNTER — Encounter: Payer: Self-pay | Admitting: Family Medicine

## 2023-01-03 VITALS — BP 100/60 | HR 85 | Temp 98.3°F | Ht 69.0 in | Wt 182.4 lb

## 2023-01-03 DIAGNOSIS — R300 Dysuria: Secondary | ICD-10-CM | POA: Diagnosis not present

## 2023-01-03 DIAGNOSIS — N3010 Interstitial cystitis (chronic) without hematuria: Secondary | ICD-10-CM

## 2023-01-03 LAB — URINALYSIS, MICROSCOPIC ONLY

## 2023-01-03 NOTE — Progress Notes (Signed)
Subjective  CC: dysuria  HPI: Monica Jackson is a 32 y.o. female who presents to the office today to address the problems listed above in the chief complaint. 32 yo w/ IC c/o 4 days of IC sxs: irritative burning, now constant w/o vaginal d/c, odor or pelvic pain. Sxs are typical of her IC flares but persists. Has used AZO with mild relief. No systemic sxs. On medications for IC: elmiron, amitriptyline. No flank pain, n/v.     Assessment  1. Interstitial cystitis   2. Dysuria      Plan  IC flare +/- UTI:  r/o infection with micro and culture (on AZO so no dipstick done today). If negative for infection, pt will need to see her urologist for further management.   Follow up: prn Visit date not found  Orders Placed This Encounter  Procedures   Urine Culture   Urine Microscopic Only   No orders of the defined types were placed in this encounter.     I reviewed the patients updated PMH, FH, and SocHx.    Patient Active Problem List   Diagnosis Date Noted   Interstitial cystitis 02/23/2022    Priority: High   PTSD (post-traumatic stress disorder) 03/15/2018    Priority: High   History of seizure 07/13/2013    Priority: High   Major depression, recurrent, chronic (HCC) 07/13/2013    Priority: High   Gastritis without bleeding 05/24/2021    Priority: Medium    IBS (irritable bowel syndrome) 11/12/2019    Priority: Medium    Pelvic floor dysfunction 08/08/2019    Priority: Medium    Deviated nasal septum 04/09/2019    Priority: Medium    Chronic rhinitis 04/09/2019    Priority: Medium    Dysfunctional voiding of urine 08/01/2016    Priority: Medium    History of loop electrical excision procedure (LEEP) 08/16/2013    Priority: Medium    Migraine without aura 03/21/2012    Priority: Medium    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    Allergies: Patient is allergic to sulfa antibiotics. Family History: Patient family history includes Cancer in her paternal grandmother; Colon polyps in her paternal grandfather; Diabetes in her maternal grandfather, maternal grandmother, and mother; Heart disease in her maternal grandfather; Hyperlipidemia in her father; Hypertension in her paternal grandfather. Social History:  Patient  reports that she has quit smoking. She has never used smokeless tobacco. She reports current alcohol use. She reports that she does not currently use drugs.  Review of Systems: Constitutional: Negative for fever malaise or anorexia Cardiovascular: negative for chest pain Respiratory: negative for SOB or persistent cough Gastrointestinal: negative for abdominal pain  Objective  Vitals: BP 100/60   Pulse 85   Temp 98.3 F (36.8 C)   Ht 5\' 9"  (1.753 m)   Wt 182 lb 6.4 oz (82.7 kg)   SpO2 99%   BMI 26.94 kg/m  General: no acute distress , A&Ox3 Abd: mild suprapubic ttp w/o rebound/guarding or mass, no cvat, soft abdomen Skin:  Warm, no rashes  Commons side effects, risks, benefits, and alternatives for medications and treatment plan prescribed today were discussed, and the patient expressed understanding of the given instructions. Patient is instructed to call or message via MyChart if he/she has any questions or concerns regarding our treatment plan. No barriers  to understanding were identified. We discussed Red Flag symptoms and signs in detail. Patient expressed understanding regarding what to do in case of urgent or emergency type symptoms.  Medication list was reconciled, printed and provided to the patient in AVS. Patient instructions and summary information was reviewed with the patient as documented in the AVS. This note was prepared with assistance of Dragon voice recognition software. Occasional wrong-word or sound-a-like substitutions may have occurred due to the inherent limitations of  voice recognition software

## 2023-01-04 MED ORDER — CIPROFLOXACIN HCL 500 MG PO TABS: 500.0000 mg | ORAL_TABLET | Freq: Two times a day (BID) | ORAL | 0 refills | Status: AC

## 2023-01-04 NOTE — Progress Notes (Signed)
See mychart note Dear Ms. Mcgurn, Your urine looks infected; I am waiting on the culture but I have ordered antibiotics to start. I'll get back to you if we need to change them.  I hope you feel better soon!  Sincerely, Dr. Mardelle Matte

## 2023-01-04 NOTE — Addendum Note (Signed)
Addended by: Asencion Partridge on: 01/04/2023 07:50 AM   Modules accepted: Orders

## 2023-01-05 LAB — URINE CULTURE
MICRO NUMBER:: 15626814
SPECIMEN QUALITY:: ADEQUATE

## 2023-01-05 NOTE — Progress Notes (Signed)
Please call patient:have the urinary symptoms improved? If not, please change to amoxicillin 500 bid x 7 days  thanks

## 2023-01-16 DIAGNOSIS — F4322 Adjustment disorder with anxiety: Secondary | ICD-10-CM | POA: Diagnosis not present

## 2023-01-24 DIAGNOSIS — F4322 Adjustment disorder with anxiety: Secondary | ICD-10-CM | POA: Diagnosis not present

## 2023-01-25 DIAGNOSIS — F4322 Adjustment disorder with anxiety: Secondary | ICD-10-CM | POA: Diagnosis not present

## 2023-02-02 DIAGNOSIS — F3341 Major depressive disorder, recurrent, in partial remission: Secondary | ICD-10-CM | POA: Diagnosis not present

## 2023-02-02 DIAGNOSIS — F411 Generalized anxiety disorder: Secondary | ICD-10-CM | POA: Diagnosis not present

## 2023-02-02 DIAGNOSIS — F4312 Post-traumatic stress disorder, chronic: Secondary | ICD-10-CM | POA: Diagnosis not present

## 2023-02-07 DIAGNOSIS — F4322 Adjustment disorder with anxiety: Secondary | ICD-10-CM | POA: Diagnosis not present

## 2023-02-27 DIAGNOSIS — F4322 Adjustment disorder with anxiety: Secondary | ICD-10-CM | POA: Diagnosis not present

## 2023-03-21 DIAGNOSIS — F4322 Adjustment disorder with anxiety: Secondary | ICD-10-CM | POA: Diagnosis not present

## 2023-04-05 DIAGNOSIS — F3341 Major depressive disorder, recurrent, in partial remission: Secondary | ICD-10-CM | POA: Diagnosis not present

## 2023-04-05 DIAGNOSIS — F411 Generalized anxiety disorder: Secondary | ICD-10-CM | POA: Diagnosis not present

## 2023-04-05 DIAGNOSIS — F4312 Post-traumatic stress disorder, chronic: Secondary | ICD-10-CM | POA: Diagnosis not present

## 2023-04-18 DIAGNOSIS — F4322 Adjustment disorder with anxiety: Secondary | ICD-10-CM | POA: Diagnosis not present

## 2023-04-24 DIAGNOSIS — F4322 Adjustment disorder with anxiety: Secondary | ICD-10-CM | POA: Diagnosis not present

## 2023-04-25 DIAGNOSIS — F4322 Adjustment disorder with anxiety: Secondary | ICD-10-CM | POA: Diagnosis not present

## 2023-05-01 DIAGNOSIS — F4322 Adjustment disorder with anxiety: Secondary | ICD-10-CM | POA: Diagnosis not present

## 2023-05-09 DIAGNOSIS — F4322 Adjustment disorder with anxiety: Secondary | ICD-10-CM | POA: Diagnosis not present

## 2023-05-11 DIAGNOSIS — F4322 Adjustment disorder with anxiety: Secondary | ICD-10-CM | POA: Diagnosis not present

## 2023-05-22 DIAGNOSIS — F4322 Adjustment disorder with anxiety: Secondary | ICD-10-CM | POA: Diagnosis not present

## 2023-05-25 ENCOUNTER — Other Ambulatory Visit: Payer: Self-pay

## 2023-05-25 ENCOUNTER — Ambulatory Visit: Payer: Self-pay | Admitting: Family Medicine

## 2023-05-25 ENCOUNTER — Other Ambulatory Visit: Payer: Self-pay | Admitting: Family Medicine

## 2023-05-25 ENCOUNTER — Encounter: Payer: Self-pay | Admitting: Family Medicine

## 2023-05-25 ENCOUNTER — Ambulatory Visit: Admitting: Family Medicine

## 2023-05-25 VITALS — BP 98/62 | HR 109 | Temp 98.7°F | Ht 69.0 in | Wt 197.4 lb

## 2023-05-25 DIAGNOSIS — B372 Candidiasis of skin and nail: Secondary | ICD-10-CM | POA: Diagnosis not present

## 2023-05-25 DIAGNOSIS — N61 Mastitis without abscess: Secondary | ICD-10-CM

## 2023-05-25 MED ORDER — NYSTATIN 100000 UNIT/GM EX CREA: 1.0000 | TOPICAL_CREAM | Freq: Two times a day (BID) | CUTANEOUS | 0 refills | Status: AC

## 2023-05-25 MED ORDER — DOXYCYCLINE HYCLATE 100 MG PO TABS: 100.0000 mg | ORAL_TABLET | Freq: Two times a day (BID) | ORAL | 0 refills | Status: AC

## 2023-05-25 NOTE — Telephone Encounter (Signed)
 Copied from CRM [id number]. Topic: Clinical - Red Word Triage >> May 25, 2023  9:07 AM Isabell A wrote: Red Word that prompted transfer to Nurse Triage: Patient states left breast is super swollen and sore, experiencing a fever & thinks it may be a blocked duct in her breast. Breast is also red.  Chief Complaint: left breast swelling Symptoms: left breast swelling, red patches , 6 to 7/10 pain fever 102.3,  Frequency: yesterday Pertinent Negatives: Patient denies drainage or lump Disposition: [] ED /[] Urgent Care (no appt availability in office) / [x] Appointment(In office/virtual)/ []  [facility]/ [] Home Care/ [] Refused Recommended Disposition /[] [facility]/ []  Follow-up with PCP Additional Notes: pt states left breast is swollen, heavy and very full - painful.  Pt started applying moist heat to breast late last night which helped a little.  Reason for Disposition  [1] Breast looks infected (spreading redness, feels hot or painful to touch) AND [2] no fever  Answer Assessment - Initial Assessment Questions 1. SYMPTOM: "What's the main symptom you're concerned about?"  (e.g., lump, pain, rash, nipple discharge)     6 or 7/10  pain 2. LOCATION: "Where is the  located?"     Left breast 3. ONSET: "When did   start?"     yesterday 4. PRIOR HISTORY: "Do you have any history of prior problems with your breasts?" (e.g., lumps, cancer, fibrocystic breast disease)     no 5. CAUSE: "What do you think is causing this symptom?"     unknown 6. OTHER SYMPTOMS: "Do you have any other symptoms?" (e.g., fever, breast pain, redness or rash, nipple discharge)     Fever 102.3, swelling/fullness, red patchy 7. PREGNANCY-BREASTFEEDING: "Is there any chance you are pregnant?" "When was your last menstrual period?" "Are you breastfeeding?"     no  Protocols used: Breast Symptoms-A-AH

## 2023-05-25 NOTE — Progress Notes (Signed)
 Acute Office Visit  Subjective:     Patient ID: Monica Jackson, female    DOB: 1990-07-05, 33 y.o.   MRN: [id number]  Chief Complaint  Patient presents with   Breast Pain    HPI Patient is in today for left breast pain since yesterday. Felt like a pulled muscle after working out at the gym. She also had  Nausea, chills with fever yesterday. Max 102.3 yesterday. Has had spreading redness across left breast. Swollen breast, feels very full. No changes in nipple. Improving pain overall. Has been afebrile today with max temp of 99. She is not breastfeeding. Has had nipple piercing x 11 years. No exudate from nipples. Does not shave or pluck hairs around nipples. Paternal grandmother had breast cancer, not immediate family members. LMP 05/04/23. Fiance has had vasectomy.   Also has had recurrent rash between and under breasts. Not itchy. Has tried steroid cream without relief. Zinc oxide improves but doesn't resolve rash. 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   ROS As per HPI.     Objective:    BP 98/62   Pulse (!) 109   Temp 98.7 F (37.1 C) (Temporal)   Ht 5\' 9"  (1.753 m)   Wt 197 lb 6.4 oz (89.5 kg)   SpO2 98%   BMI 29.15 kg/m    BP Readings from Last 3 Encounters:  05/25/23 98/62  01/03/23 100/60  08/22/22 114/62   Pulse Readings from Last 3 Encounters:  05/25/23 (!) 109  01/03/23 85  08/22/22 (!) 114    Physical Exam Vitals and nursing note reviewed.  Constitutional:      General: She is not in acute distress.    Appearance: She is not ill-appearing, toxic-appearing or diaphoretic.  Cardiovascular:     Rate and Rhythm: Normal rate and regular rhythm.     Heart sounds: Normal heart sounds. No murmur heard. Pulmonary:     Effort: Pulmonary effort is normal. No respiratory distress.     Breath sounds: Normal breath sounds. No wheezing, rhonchi or rales.  Chest:  Breasts:    Right: Normal.     Left: Swelling, skin change (erythema) and  tenderness present. No inverted nipple, mass or nipple discharge.     Comments: Warmth to left breast. Bilateral nipple piercing intact- no tenderness, exudate or erythema to nipples.  Lymphadenopathy:     Upper Body:     Right upper body: No pectoral adenopathy.     Left upper body: No pectoral adenopathy.  Skin:    General: Skin is warm and dry.     Findings: Rash (yeast dermaitis between breasts, under bilateral breasts) present.  Neurological:     General: No focal deficit present.     Mental Status: She is alert and oriented to person, place, and time.  Psychiatric:        Mood and Affect: Mood normal.        Behavior: Behavior normal.     No results found for any visits on 05/25/23.      Assessment & Plan:   Monica Jackson was seen today for breast pain.  Diagnoses and all orders for this visit:  Mastitis, left, acute Not breastfeeding. Doxycyline as below. STAT diagnostic mammo discussed and ordered as well. Will recheck next week, sooner for new or worsening symptoms.  -     Cancel: MM Digital Diagnostic Unilat L; Future -     doxycycline (VIBRA-TABS) 100 MG tablet; Take 1 tablet (  100 mg total) by mouth 2 (two) times daily for 7 days.  Rash Nystatin as below. Keep area as clean and dry as possible.  -     nystatin cream (MYCOSTATIN); Apply 1 Application topically 2 (two) times daily for 10 days.  The patient indicates understanding of these issues and agrees with the plan.  Gabriel Earing, FNP

## 2023-05-25 NOTE — Telephone Encounter (Signed)
 FYI-Pt scheduled to be seen by Gabriel Earing, FNP  this afternoon.

## 2023-05-29 DIAGNOSIS — F4322 Adjustment disorder with anxiety: Secondary | ICD-10-CM | POA: Diagnosis not present

## 2023-05-31 ENCOUNTER — Ambulatory Visit: Admitting: Family Medicine

## 2023-05-31 ENCOUNTER — Encounter: Payer: Self-pay | Admitting: Family Medicine

## 2023-05-31 VITALS — BP 109/74 | HR 98 | Temp 96.9°F | Ht 69.0 in | Wt 192.8 lb

## 2023-05-31 DIAGNOSIS — N61 Mastitis without abscess: Secondary | ICD-10-CM | POA: Diagnosis not present

## 2023-05-31 NOTE — Progress Notes (Signed)
   Acute Office Visit  Subjective:     Patient ID: Monica Jackson, female    DOB: [birth year], 33 y.o.   MRN: [id number]  Chief Complaint  Patient presents with   Breast Problem    HPI Patient is in today for follow up of mastitis. She has been taking doxycyline as prescribed. She has been afebrile. Reports decreased pain, swelling, redness, and warmth of breast. No exudate of symptoms of nipples. She has mammogram, [place], and potential aspiration scheduled for 06/05/23.   ROS As per HPI.      Objective:    BP 109/74   Pulse 98   Temp (!) 96.9 F (36.1 C) (Temporal)   Ht 5\' 9"  (1.753 m)   Wt 192 lb 12.8 oz (87.5 kg)   SpO2 97%   BMI 28.47 kg/m    Physical Exam Vitals and nursing note reviewed. Exam conducted with a chaperone present.  Constitutional:      General: She is not in acute distress.    Appearance: Normal appearance. She is not ill-appearing, toxic-appearing or diaphoretic.  Pulmonary:     Effort: Pulmonary effort is normal. No respiratory distress.  Chest:  Breasts:    Breasts are symmetrical.     Right: No swelling, inverted nipple, nipple discharge or skin change.     Left: Swelling (minimal, generalized) present. No bleeding, inverted nipple, mass, nipple discharge, skin change or tenderness.  Lymphadenopathy:     Upper Body:     Left upper body: No supraclavicular, axillary or pectoral adenopathy.  Skin:    General: Skin is warm and dry.  Neurological:     General: No focal deficit present.     Mental Status: She is alert and oriented to person, place, and time.  Psychiatric:        Mood and Affect: Mood normal.        Behavior: Behavior normal.     No results found for any visits on 05/31/23.      Assessment & Plan:   Monica Jackson was seen today for breast problem.  Diagnoses and all orders for this visit:  Mastitis, left, acute Significant improvement with doxycyline. Complete as prescribed. Keep mammo/US appt for further evaluation. Return  to office for new or worsening symptoms, or if symptoms persist.    Return if symptoms worsen or fail to improve.  The patient indicates understanding of these issues and agrees with the plan.  Gabriel Earing, FNP

## 2023-06-05 ENCOUNTER — Ambulatory Visit

## 2023-06-05 ENCOUNTER — Ambulatory Visit
Admission: RE | Admit: 2023-06-05 | Discharge: 2023-06-05 | Disposition: A | Discharge status: Home / Self Care | Source: Ambulatory Visit | Attending: Family Medicine | Admitting: Family Medicine

## 2023-06-05 DIAGNOSIS — N644 Mastodynia: Secondary | ICD-10-CM | POA: Diagnosis not present

## 2023-06-05 DIAGNOSIS — N61 Mastitis without abscess: Secondary | ICD-10-CM

## 2023-06-07 ENCOUNTER — Encounter: Payer: Self-pay | Admitting: Family Medicine

## 2023-06-20 DIAGNOSIS — F4322 Adjustment disorder with anxiety: Secondary | ICD-10-CM | POA: Diagnosis not present

## 2023-06-20 DIAGNOSIS — F411 Generalized anxiety disorder: Secondary | ICD-10-CM | POA: Diagnosis not present

## 2023-06-20 DIAGNOSIS — F4312 Post-traumatic stress disorder, chronic: Secondary | ICD-10-CM | POA: Diagnosis not present

## 2023-06-20 DIAGNOSIS — F333 Major depressive disorder, recurrent, severe with psychotic symptoms: Secondary | ICD-10-CM | POA: Diagnosis not present

## 2023-06-29 DIAGNOSIS — F4322 Adjustment disorder with anxiety: Secondary | ICD-10-CM | POA: Diagnosis not present

## 2023-07-17 DIAGNOSIS — F4322 Adjustment disorder with anxiety: Secondary | ICD-10-CM | POA: Diagnosis not present

## 2023-07-18 DIAGNOSIS — F3341 Major depressive disorder, recurrent, in partial remission: Secondary | ICD-10-CM | POA: Diagnosis not present

## 2023-07-18 DIAGNOSIS — K580 Irritable bowel syndrome with diarrhea: Secondary | ICD-10-CM | POA: Diagnosis not present

## 2023-07-18 DIAGNOSIS — M6289 Other specified disorders of muscle: Secondary | ICD-10-CM | POA: Diagnosis not present

## 2023-07-18 DIAGNOSIS — N3010 Interstitial cystitis (chronic) without hematuria: Secondary | ICD-10-CM | POA: Diagnosis not present

## 2023-07-18 DIAGNOSIS — F332 Major depressive disorder, recurrent severe without psychotic features: Secondary | ICD-10-CM | POA: Diagnosis not present

## 2023-07-18 DIAGNOSIS — F1320 Sedative, hypnotic or anxiolytic dependence, uncomplicated: Secondary | ICD-10-CM | POA: Diagnosis not present

## 2023-07-18 DIAGNOSIS — F4312 Post-traumatic stress disorder, chronic: Secondary | ICD-10-CM | POA: Diagnosis not present

## 2023-07-18 DIAGNOSIS — F4322 Adjustment disorder with anxiety: Secondary | ICD-10-CM | POA: Diagnosis not present

## 2023-07-18 DIAGNOSIS — Z5181 Encounter for therapeutic drug level monitoring: Secondary | ICD-10-CM | POA: Diagnosis not present

## 2023-07-18 DIAGNOSIS — F411 Generalized anxiety disorder: Secondary | ICD-10-CM | POA: Diagnosis not present

## 2023-07-18 DIAGNOSIS — F331 Major depressive disorder, recurrent, moderate: Secondary | ICD-10-CM | POA: Diagnosis not present

## 2023-07-21 DIAGNOSIS — F4312 Post-traumatic stress disorder, chronic: Secondary | ICD-10-CM | POA: Diagnosis not present

## 2023-07-21 DIAGNOSIS — F3341 Major depressive disorder, recurrent, in partial remission: Secondary | ICD-10-CM | POA: Diagnosis not present

## 2023-07-21 DIAGNOSIS — F331 Major depressive disorder, recurrent, moderate: Secondary | ICD-10-CM | POA: Diagnosis not present

## 2023-07-21 DIAGNOSIS — Z5181 Encounter for therapeutic drug level monitoring: Secondary | ICD-10-CM | POA: Diagnosis not present

## 2023-07-21 DIAGNOSIS — F411 Generalized anxiety disorder: Secondary | ICD-10-CM | POA: Diagnosis not present

## 2023-07-21 DIAGNOSIS — F332 Major depressive disorder, recurrent severe without psychotic features: Secondary | ICD-10-CM | POA: Diagnosis not present

## 2023-07-21 DIAGNOSIS — F1320 Sedative, hypnotic or anxiolytic dependence, uncomplicated: Secondary | ICD-10-CM | POA: Diagnosis not present

## 2023-07-24 DIAGNOSIS — Z5181 Encounter for therapeutic drug level monitoring: Secondary | ICD-10-CM | POA: Diagnosis not present

## 2023-07-24 DIAGNOSIS — F1320 Sedative, hypnotic or anxiolytic dependence, uncomplicated: Secondary | ICD-10-CM | POA: Diagnosis not present

## 2023-07-24 DIAGNOSIS — F332 Major depressive disorder, recurrent severe without psychotic features: Secondary | ICD-10-CM | POA: Diagnosis not present

## 2023-07-26 DIAGNOSIS — F331 Major depressive disorder, recurrent, moderate: Secondary | ICD-10-CM | POA: Diagnosis not present

## 2023-07-26 DIAGNOSIS — F1320 Sedative, hypnotic or anxiolytic dependence, uncomplicated: Secondary | ICD-10-CM | POA: Diagnosis not present

## 2023-07-26 DIAGNOSIS — F4312 Post-traumatic stress disorder, chronic: Secondary | ICD-10-CM | POA: Diagnosis not present

## 2023-07-26 DIAGNOSIS — F332 Major depressive disorder, recurrent severe without psychotic features: Secondary | ICD-10-CM | POA: Diagnosis not present

## 2023-07-26 DIAGNOSIS — F411 Generalized anxiety disorder: Secondary | ICD-10-CM | POA: Diagnosis not present

## 2023-07-26 DIAGNOSIS — F3341 Major depressive disorder, recurrent, in partial remission: Secondary | ICD-10-CM | POA: Diagnosis not present

## 2023-07-26 DIAGNOSIS — Z5181 Encounter for therapeutic drug level monitoring: Secondary | ICD-10-CM | POA: Diagnosis not present

## 2023-08-01 DIAGNOSIS — F4322 Adjustment disorder with anxiety: Secondary | ICD-10-CM | POA: Diagnosis not present

## 2023-08-02 DIAGNOSIS — F331 Major depressive disorder, recurrent, moderate: Secondary | ICD-10-CM | POA: Diagnosis not present

## 2023-08-02 DIAGNOSIS — F4312 Post-traumatic stress disorder, chronic: Secondary | ICD-10-CM | POA: Diagnosis not present

## 2023-08-02 DIAGNOSIS — F411 Generalized anxiety disorder: Secondary | ICD-10-CM | POA: Diagnosis not present

## 2023-08-02 DIAGNOSIS — Z5181 Encounter for therapeutic drug level monitoring: Secondary | ICD-10-CM | POA: Diagnosis not present

## 2023-08-02 DIAGNOSIS — F332 Major depressive disorder, recurrent severe without psychotic features: Secondary | ICD-10-CM | POA: Diagnosis not present

## 2023-08-02 DIAGNOSIS — F3341 Major depressive disorder, recurrent, in partial remission: Secondary | ICD-10-CM | POA: Diagnosis not present

## 2023-08-02 DIAGNOSIS — F1320 Sedative, hypnotic or anxiolytic dependence, uncomplicated: Secondary | ICD-10-CM | POA: Diagnosis not present

## 2023-08-04 DIAGNOSIS — F411 Generalized anxiety disorder: Secondary | ICD-10-CM | POA: Diagnosis not present

## 2023-08-04 DIAGNOSIS — Z5181 Encounter for therapeutic drug level monitoring: Secondary | ICD-10-CM | POA: Diagnosis not present

## 2023-08-04 DIAGNOSIS — F4312 Post-traumatic stress disorder, chronic: Secondary | ICD-10-CM | POA: Diagnosis not present

## 2023-08-04 DIAGNOSIS — F3341 Major depressive disorder, recurrent, in partial remission: Secondary | ICD-10-CM | POA: Diagnosis not present

## 2023-08-04 DIAGNOSIS — F1320 Sedative, hypnotic or anxiolytic dependence, uncomplicated: Secondary | ICD-10-CM | POA: Diagnosis not present

## 2023-08-04 DIAGNOSIS — F331 Major depressive disorder, recurrent, moderate: Secondary | ICD-10-CM | POA: Diagnosis not present

## 2023-08-08 DIAGNOSIS — F4322 Adjustment disorder with anxiety: Secondary | ICD-10-CM | POA: Diagnosis not present

## 2023-08-09 DIAGNOSIS — F331 Major depressive disorder, recurrent, moderate: Secondary | ICD-10-CM | POA: Diagnosis not present

## 2023-08-09 DIAGNOSIS — Z5181 Encounter for therapeutic drug level monitoring: Secondary | ICD-10-CM | POA: Diagnosis not present

## 2023-08-09 DIAGNOSIS — F4312 Post-traumatic stress disorder, chronic: Secondary | ICD-10-CM | POA: Diagnosis not present

## 2023-08-09 DIAGNOSIS — F1320 Sedative, hypnotic or anxiolytic dependence, uncomplicated: Secondary | ICD-10-CM | POA: Diagnosis not present

## 2023-08-09 DIAGNOSIS — F3341 Major depressive disorder, recurrent, in partial remission: Secondary | ICD-10-CM | POA: Diagnosis not present

## 2023-08-09 DIAGNOSIS — F411 Generalized anxiety disorder: Secondary | ICD-10-CM | POA: Diagnosis not present

## 2023-08-14 DIAGNOSIS — F331 Major depressive disorder, recurrent, moderate: Secondary | ICD-10-CM | POA: Diagnosis not present

## 2023-08-14 DIAGNOSIS — F4312 Post-traumatic stress disorder, chronic: Secondary | ICD-10-CM | POA: Diagnosis not present

## 2023-08-14 DIAGNOSIS — F411 Generalized anxiety disorder: Secondary | ICD-10-CM | POA: Diagnosis not present

## 2023-08-16 DIAGNOSIS — F331 Major depressive disorder, recurrent, moderate: Secondary | ICD-10-CM | POA: Diagnosis not present

## 2023-08-16 DIAGNOSIS — F1320 Sedative, hypnotic or anxiolytic dependence, uncomplicated: Secondary | ICD-10-CM | POA: Diagnosis not present

## 2023-08-16 DIAGNOSIS — Z5181 Encounter for therapeutic drug level monitoring: Secondary | ICD-10-CM | POA: Diagnosis not present

## 2023-08-17 DIAGNOSIS — F331 Major depressive disorder, recurrent, moderate: Secondary | ICD-10-CM | POA: Diagnosis not present

## 2023-08-17 DIAGNOSIS — F4322 Adjustment disorder with anxiety: Secondary | ICD-10-CM | POA: Diagnosis not present

## 2023-08-17 DIAGNOSIS — F3341 Major depressive disorder, recurrent, in partial remission: Secondary | ICD-10-CM | POA: Diagnosis not present

## 2023-08-17 DIAGNOSIS — F4312 Post-traumatic stress disorder, chronic: Secondary | ICD-10-CM | POA: Diagnosis not present

## 2023-08-17 DIAGNOSIS — F411 Generalized anxiety disorder: Secondary | ICD-10-CM | POA: Diagnosis not present

## 2023-08-22 DIAGNOSIS — F4322 Adjustment disorder with anxiety: Secondary | ICD-10-CM | POA: Diagnosis not present

## 2023-08-23 DIAGNOSIS — F331 Major depressive disorder, recurrent, moderate: Secondary | ICD-10-CM | POA: Diagnosis not present

## 2023-08-25 DIAGNOSIS — F332 Major depressive disorder, recurrent severe without psychotic features: Secondary | ICD-10-CM | POA: Diagnosis not present

## 2023-08-30 DIAGNOSIS — F332 Major depressive disorder, recurrent severe without psychotic features: Secondary | ICD-10-CM | POA: Diagnosis not present

## 2023-09-04 DIAGNOSIS — F332 Major depressive disorder, recurrent severe without psychotic features: Secondary | ICD-10-CM | POA: Diagnosis not present

## 2023-09-18 DIAGNOSIS — F4322 Adjustment disorder with anxiety: Secondary | ICD-10-CM | POA: Diagnosis not present

## 2023-10-03 ENCOUNTER — Ambulatory Visit: Payer: Self-pay | Admitting: *Deleted

## 2023-10-03 DIAGNOSIS — F331 Major depressive disorder, recurrent, moderate: Secondary | ICD-10-CM | POA: Diagnosis not present

## 2023-10-03 NOTE — Telephone Encounter (Signed)
 Appt 10/05/2023

## 2023-10-03 NOTE — Telephone Encounter (Signed)
 FYI Only or Action Required?: Action required by provider: request for appointment.  Patient was last seen in primary care on 05/31/2023 by Joesph Annabella HERO, FNP.  Called Nurse Triage reporting Vaginal Discharge.  Symptoms began several days ago.  Interventions attempted: OTC medications: AZO.  Symptoms are: gradually worsening.  Triage Disposition: See Physician Within 24 Hours  Patient/caregiver understands and will follow disposition?: Yes               Copied from CRM [id number]. Topic: Clinical - Red Word Triage >> Oct 03, 2023 11:10 AM Carlyon D wrote: Red Word that prompted transfer to Nurse Triage: Yeast infection, fathom pain, severe burning when urinating and when not urinating. Itchy irritation inflamed red just started having discharge. Reason for Disposition  [1] Mild lower abdominal pain comes and goes (cramps) AND [2] lasts > 24 hours  Answer Assessment - Initial Assessment Questions Appt scheduled for 10/05/23 with other provider. No available appt with PCP.      1. DISCHARGE: Describe the discharge. (e.g., white, yellow, green, gray, foamy, cottage cheese-like)     White cloudy  2. ODOR: Is there a bad odor?     Na  3. ONSET: When did the discharge begin?     2 days ago  4. RASH: Is there a rash in the genital area? If Yes, ask: Describe it. (e.g., redness, blisters, sores, bumps)     redness 5. ABDOMEN PAIN: Are you having any abdomen pain? If Yes, ask: What does it feel like?  (e.g., crampy, dull, intermittent, constant)      dull 6. ABDOMEN PAIN SEVERITY: If present, ask: How bad is it? (e.g., Scale 1-10; mild, moderate, or severe)     Moderate  7. CAUSE: What do you think is causing the discharge? Have you had the same problem before? What happened then?     Yeast infection. Took antibiotics x 2 weeks ago [place] when [place] 8. OTHER SYMPTOMS: Do you have any other symptoms? (e.g., fever, itching, urination pain,  vaginal bleeding, vaginal foreign body)     Burning with urination, redness to vaginal area, itching, white to cloudy discharge, has been using AZO.  9. PREGNANCY: Is there any chance you are pregnant? When was your last menstrual period?     na  Protocols used: Vaginal Discharge-A-AH

## 2023-10-05 ENCOUNTER — Ambulatory Visit: Admitting: Family

## 2023-10-05 DIAGNOSIS — F4322 Adjustment disorder with anxiety: Secondary | ICD-10-CM | POA: Diagnosis not present

## 2023-10-05 DIAGNOSIS — F331 Major depressive disorder, recurrent, moderate: Secondary | ICD-10-CM | POA: Diagnosis not present

## 2023-10-23 DIAGNOSIS — F4312 Post-traumatic stress disorder, chronic: Secondary | ICD-10-CM | POA: Diagnosis not present

## 2023-10-23 DIAGNOSIS — F331 Major depressive disorder, recurrent, moderate: Secondary | ICD-10-CM | POA: Diagnosis not present

## 2023-10-23 DIAGNOSIS — F411 Generalized anxiety disorder: Secondary | ICD-10-CM | POA: Diagnosis not present

## 2023-10-25 DIAGNOSIS — R3989 Other symptoms and signs involving the genitourinary system: Secondary | ICD-10-CM | POA: Diagnosis not present

## 2023-11-15 DIAGNOSIS — F331 Major depressive disorder, recurrent, moderate: Secondary | ICD-10-CM | POA: Diagnosis not present

## 2023-11-20 ENCOUNTER — Encounter: Payer: Self-pay | Admitting: Family Medicine

## 2023-11-20 ENCOUNTER — Ambulatory Visit (INDEPENDENT_AMBULATORY_CARE_PROVIDER_SITE_OTHER): Admitting: Family Medicine

## 2023-11-20 VITALS — BP 111/78 | HR 98 | Temp 97.7°F | Ht 69.0 in | Wt 188.4 lb

## 2023-11-20 DIAGNOSIS — F339 Major depressive disorder, recurrent, unspecified: Secondary | ICD-10-CM

## 2023-11-20 DIAGNOSIS — G43009 Migraine without aura, not intractable, without status migrainosus: Secondary | ICD-10-CM | POA: Diagnosis not present

## 2023-11-20 DIAGNOSIS — K588 Other irritable bowel syndrome: Secondary | ICD-10-CM | POA: Diagnosis not present

## 2023-11-20 DIAGNOSIS — Z0001 Encounter for general adult medical examination with abnormal findings: Secondary | ICD-10-CM

## 2023-11-20 DIAGNOSIS — Z1159 Encounter for screening for other viral diseases: Secondary | ICD-10-CM

## 2023-11-20 DIAGNOSIS — N3010 Interstitial cystitis (chronic) without hematuria: Secondary | ICD-10-CM | POA: Diagnosis not present

## 2023-11-20 DIAGNOSIS — E663 Overweight: Secondary | ICD-10-CM | POA: Diagnosis not present

## 2023-11-20 LAB — CBC WITH DIFFERENTIAL/PLATELET
Basophils Absolute: 0 K/uL (ref 0.0–0.1)
Basophils Relative: 0.7 % (ref 0.0–3.0)
Eosinophils Absolute: 0 K/uL (ref 0.0–0.7)
Eosinophils Relative: 0.5 % (ref 0.0–5.0)
HCT: 37.2 % (ref 36.0–46.0)
Hemoglobin: 12.5 g/dL (ref 12.0–15.0)
Lymphocytes Relative: 35.4 % (ref 12.0–46.0)
Lymphs Abs: 1.6 K/uL (ref 0.7–4.0)
MCHC: 33.7 g/dL (ref 30.0–36.0)
MCV: 91.4 fl (ref 78.0–100.0)
Monocytes Absolute: 0.4 K/uL (ref 0.1–1.0)
Monocytes Relative: 9.4 % (ref 3.0–12.0)
Neutro Abs: 2.5 K/uL (ref 1.4–7.7)
Neutrophils Relative %: 54 % (ref 43.0–77.0)
Platelets: 265 K/uL (ref 150.0–400.0)
RBC: 4.07 Mil/uL (ref 3.87–5.11)
RDW: 12.5 % (ref 11.5–15.5)
WBC: 4.6 K/uL (ref 4.0–10.5)

## 2023-11-20 LAB — COMPREHENSIVE METABOLIC PANEL WITH GFR
ALT: 11 U/L (ref 0–35)
AST: 12 U/L (ref 0–37)
Albumin: 4.4 g/dL (ref 3.5–5.2)
Alkaline Phosphatase: 44 U/L (ref 39–117)
BUN: 11 mg/dL (ref 6–23)
CO2: 26 meq/L (ref 19–32)
Calcium: 9.4 mg/dL (ref 8.4–10.5)
Chloride: 109 meq/L (ref 96–112)
Creatinine, Ser: 0.96 mg/dL (ref 0.40–1.20)
GFR: 79.18 mL/min (ref >60.00)
Glucose, Bld: 79 mg/dL (ref 70–99)
Potassium: 4.6 meq/L (ref 3.5–5.1)
Sodium: 141 meq/L (ref 135–145)
Total Bilirubin: 0.3 mg/dL (ref 0.2–1.2)
Total Protein: 6.7 g/dL (ref 6.0–8.3)

## 2023-11-20 LAB — LIPID PANEL
Cholesterol: 179 mg/dL (ref 0–200)
HDL: 43.8 mg/dL (ref >39.00)
LDL Cholesterol: 97 mg/dL (ref 0–99)
NonHDL: 134.87
Total CHOL/HDL Ratio: 4
Triglycerides: 187 mg/dL — ABNORMAL HIGH (ref 0.0–149.0)
VLDL: 37.4 mg/dL (ref 0.0–40.0)

## 2023-11-20 LAB — TSH: TSH: 1.11 u[IU]/mL (ref 0.35–5.50)

## 2023-11-20 MED ORDER — PHENTERMINE HCL 37.5 MG PO TABS: 18.2500 mg | ORAL_TABLET | Freq: Every day | ORAL | 2 refills | Status: AC

## 2023-11-20 NOTE — Patient Instructions (Signed)
Please return in 12 months for your annual complete physical; please come fasting.   I will release your lab results to you on your MyChart account with further instructions. You may see the results before I do, but when I review them I will send you a message with my report or have my assistant call you if things need to be discussed. Please reply to my message with any questions. Thank you!   If you have any questions or concerns, please don't hesitate to send me a message via MyChart or call the office at [phone]. Thank you for visiting with [place] today! It's our pleasure caring for you.   Preventive Care 24-25 Years Old, Female Preventive care refers to lifestyle choices and visits with your health care provider that can promote health and wellness. Preventive care visits are also called wellness exams. What can I expect for my preventive care visit? Counseling During your preventive care visit, your health care provider may ask about your: Medical history, including: Past medical problems. Family medical history. Pregnancy history. Current health, including: Menstrual cycle. Method of birth control. Emotional well-being. Home life and relationship well-being. Sexual activity and sexual health. Lifestyle, including: Alcohol, nicotine or tobacco, and drug use. Access to firearms. Diet, exercise, and sleep habits. Work and work Astronomer. Sunscreen use. Safety issues such as seatbelt and bike helmet use. Physical exam Your health care provider may check your: Height and weight. These may be used to calculate your BMI (body mass index). BMI is a measurement that tells if you are at a healthy weight. Waist circumference. This measures the distance around your waistline. This measurement also tells if you are at a healthy weight and may help predict your risk of certain diseases, such as type 2 diabetes and high blood pressure. Heart rate and blood pressure. Body  temperature. Skin for abnormal spots. What immunizations do I need?  Vaccines are usually given at various ages, according to a schedule. Your health care provider will recommend vaccines for you based on your age, medical history, and lifestyle or other factors, such as travel or where you work. What tests do I need? Screening Your health care provider may recommend screening tests for certain conditions. This may include: Pelvic exam and Pap test. Lipid and cholesterol levels. Diabetes screening. This is done by checking your blood sugar (glucose) after you have not eaten for a while (fasting). Hepatitis B test. Hepatitis C test. HIV (human immunodeficiency virus) test. STI (sexually transmitted infection) testing, if you are at risk. BRCA-related cancer screening. This may be done if you have a family history of breast, ovarian, tubal, or peritoneal cancers. Talk with your health care provider about your test results, treatment options, and if necessary, the need for more tests. Follow these instructions at home: Eating and drinking  Eat a healthy diet that includes fresh fruits and vegetables, whole grains, lean protein, and low-fat dairy products. Take vitamin and mineral supplements as recommended by your health care provider. Do not drink alcohol if: Your health care provider tells you not to drink. You are pregnant, may be pregnant, or are planning to become pregnant. If you drink alcohol: Limit how much you have to 0-1 drink a day. Know how much alcohol is in your drink. In the [place] one drink equals one 12 oz bottle of beer (355 mL), one 5 oz glass of wine (148 mL), or one 1 oz glass of hard liquor (44 mL). Lifestyle Brush your teeth every morning and  night with fluoride toothpaste. Floss one time each day. Exercise for at least 30 minutes 5 or more days each week. Do not use any products that contain nicotine or tobacco. These products include cigarettes, chewing tobacco,  and vaping devices, such as e-cigarettes. If you need help quitting, ask your health care provider. Do not use drugs. If you are sexually active, practice safe sex. Use a condom or other form of protection to prevent STIs. If you do not wish to become pregnant, use a form of birth control. If you plan to become pregnant, see your health care provider for a prepregnancy visit. Find healthy ways to manage stress, such as: Meditation, yoga, or listening to music. Journaling. Talking to a trusted person. Spending time with friends and family. Minimize exposure to UV radiation to reduce your risk of skin cancer. Safety Always wear your seat belt while driving or riding in a vehicle. Do not drive: If you have been drinking alcohol. Do not ride with someone who has been drinking. If you have been using any mind-altering substances or drugs. While texting. When you are tired or distracted. Wear a helmet and other protective equipment during sports activities. If you have firearms in your house, make sure you follow all gun safety procedures. Seek help if you have been physically or sexually abused. What's next? Go to your health care provider once a year for an annual wellness visit. Ask your health care provider how often you should have your eyes and teeth checked. Stay up to date on all vaccines. This information is not intended to replace advice given to you by your health care provider. Make sure you discuss any questions you have with your health care provider. Document Revised: 08/26/2020 Document Reviewed: 08/26/2020 Elsevier Patient Education  2024 [employer].

## 2023-11-20 NOTE — Progress Notes (Signed)
 Subjective  Chief Complaint  Patient presents with   Annual Exam    Pt here for Annual Exam and is not currently fasting. Pt declined flu   Weight Loss    HPI: Monica Jackson is a 33 y.o. female who presents to [facility] [place] today for a Female Wellness Visit.  She also has the concerns and/or needs as listed above in the chief complaint. These will be addressed in addition to the Health Maintenance Visit.   Wellness Visit: annual visit with health maintenance review and exam HM: pap current with [facility]. Healthy lifestyle. Declines vaccines at this time.  Chronic disease management visit and/or acute problem visit: IC: Difficult summer with residual cystitis flares.  I reviewed urology notes.  Fortunately has improved.  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  Treated with Doxy.   Major depression: Has had a good response to Spravato treatments.  No longer on oral antidepressants.  Very happy about this.  Anxiety is mostly situational.  She is able to cope and use intermittent acute treatments as needed.  Still follows along with psychiatry. No significant IBS or GERD symptoms at this time. Migraines are controlled on Topamax . Concerned about weight: BMI of 27.  Would like to be in the normal BMI range.  Has tried intermittent fasting, prolonged fast, and different types of diets.  They will work short-term but not long-term.  Her diet is mostly good although she eats out often because of her travel for work.  This does limit some of times her choices.  She has not been on weight loss medications.  She does have family members that have not done well on Wellbutrin so would not be open to trying Contrave.  No contraindications to a stimulant.  Assessment  1. Encounter for well adult exam with abnormal findings   2. Interstitial cystitis   3. Major depression, recurrent, chronic (HCC)   4. Other irritable bowel syndrome   5.  Migraine without aura and without status migrainosus, not intractable   6. Need for hepatitis C screening test   7. Overweight      Plan  Female Wellness Visit: Age appropriate Health Maintenance and Prevention measures were discussed with patient. Included topics are cancer screening recommendations, ways to keep healthy (see AVS) including dietary and exercise recommendations, regular eye and dental care, use of seat belts, and avoidance of moderate alcohol use and tobacco use.  Screens are current BMI: discussed patient's BMI and encouraged positive lifestyle modifications to help get to or maintain a target BMI. HM needs and immunizations were addressed and ordered. See below for orders. See HM and immunization section for updates.  Patient declines Routine labs and screening tests ordered including cmp, cbc and lipids where appropriate. Discussed recommendations regarding Vit D and calcium supplementation (see AVS)  Chronic disease f/u and/or acute problem visit: (deemed necessary to be done in addition to the wellness visit): Overweight: Had a good discussion about her diet and exercise routine.  Recommend clean strict diet avoiding processed food.  Recommend resistance training.  Discussed use of phentermine  as an adjuvant.  Discussed risks benefits and possible side effects. Migraines well-controlled on preventatives Mood is much improved on Spravato Interstitial cystitis: Currently in remission.  Managed by neurology.  Unfortunately, Dr. Janit is retiring.  Follow up: 1 year for complete physical  Orders Placed This Encounter  Procedures   CBC with Differential/Platelet   Comprehensive metabolic panel  with GFR   Lipid panel   TSH   Hepatitis C antibody   Meds ordered this encounter  Medications   phentermine  (ADIPEX-P ) 37.5 MG tablet    Sig: Take 0.5-1 tablets (18.75-37.5 mg total) by mouth daily before breakfast.    Dispense:  30 tablet    Refill:  2       Body mass  index is 27.82 kg/m. Wt Readings from Last 3 Encounters:  11/20/23 188 lb 6.4 oz (85.5 kg)  05/31/23 192 lb 12.8 oz (87.5 kg)  05/25/23 197 lb 6.4 oz (89.5 kg)     Patient Active Problem List   Diagnosis Date Noted   Interstitial cystitis 02/23/2022    Priority: High    Follows with Dr. Janit of urology    PTSD (post-traumatic stress disorder) 03/15/2018    Priority: High   History of seizure 07/13/2013    Priority: High    2015. x1 only. ? If drug related.     Major depression, recurrent, chronic (HCC) 07/13/2013    Priority: High   Gastritis without bleeding 05/24/2021    Priority: Medium    IBS (irritable bowel syndrome) 11/12/2019    Priority: Medium     Colonoscopy 11/2019. Has been doing well.     Pelvic floor dysfunction 08/08/2019    Priority: Medium     Done PT. Brought on by stress.     Deviated nasal septum 04/09/2019    Priority: Medium    Chronic rhinitis 04/09/2019    Priority: Medium    Dysfunctional voiding of urine 08/01/2016    Priority: Medium      [facility] by [facility]    History of loop electrical excision procedure (LEEP) 08/16/2013    Priority: Medium     CIN2 2015; Dr. 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 Deviated septum and enlarged turbinates. Dr. Gretta. Possible surgical intervention.     Health Maintenance  Topic Date Due   Hepatitis C Screening  Never done   COVID-19 Vaccine (1 - 2024-25 season) 12/06/2023 (Originally 11/13/2023)   Influenza Vaccine  06/11/2024 (Originally 10/13/2023)   Cervical Cancer Screening (HPV/Pap Cotest)  05/31/2025   DTaP/Tdap/Td (9 - Td or Tdap) 07/26/2029   Hepatitis B Vaccines 19-59 Average Risk  Completed   HPV VACCINES  Completed   HIV Screening  Completed   Pneumococcal Vaccine  Aged Out   Meningococcal B Vaccine  Aged Out   Immunization History  Administered Date(s) Administered   DTaP  03/01/1993, 05/04/1993, 08/17/1993, 06/29/1994, 02/16/1998   HIB (PRP-OMP) 03/01/1993, 05/04/1993, 08/17/1993, 06/29/1994   HPV Quadrivalent 07/27/2009, 10/22/2009, 03/02/2010   Hepatitis A, Ped/Adol-2 Dose 07/27/2009, 02/16/2010   Hepatitis B, PED/ADOLESCENT 04-29-1992, 01/27/1993, 08/17/1993   Influenza Nasal 02/16/2010   Influenza Split 03/21/2012   Influenza,inj,Quad PF,6+ Mos 05/24/2021   Influenza-Unspecified 03/02/2010   MMR 01/18/1994, 02/16/1998   Meningococcal Conjugate 07/27/2009   Meningococcal polysaccharide vaccine (MPSV4) 04/07/2011   OPV 03/01/1993, 05/04/1993, 08/17/1993, 02/16/1998   Td 06/18/2009   Tdap 06/18/2009, 07/27/2019   Varicella 01/18/1994   We updated and reviewed the patient's past history in detail and it is documented below. Allergies: Patient  reports current alcohol use. Past Medical History Patient  has a past medical history of Allergy, Anxiety, Depression, GERD (gastroesophageal reflux disease), Glaucoma, H/O bladder infections, IBS (irritable bowel syndrome), IUD (intrauterine device) in  place (05/24/2021), Seizures (HCC), and Substance abuse (HCC). Past Surgical History Patient  has a past surgical history that includes Cosmetic surgery; LEEP (N/A, 08/27/2013); Upper gastrointestinal endoscopy (01/19/2021); and Appendectomy (07/2022). Social History   Socioeconomic History   Marital status: Single    Spouse name: Not on file   Number of children: Not on file   Years of education: Not on file   Highest education level: Some college, no degree  Occupational History   Not on file  Tobacco Use   Smoking status: Former   Smokeless tobacco: Never  Vaping Use   Vaping status: Former  Substance and Sexual Activity   Alcohol use: Yes    Alcohol/week: 0.0 standard drinks of alcohol    Comment: occasional   Drug use: Not Currently    Comment: Clean for several months   Sexual activity: Never    Birth control/protection: None, I.U.D.  Other  Topics Concern   Not on file  Social History Narrative   Not on file   Social Drivers of Health   Financial Resource Strain: Low Risk  (08/22/2022)   Overall Financial Resource Strain (CARDIA)    Difficulty of Paying Living Expenses: Not very hard  Food Insecurity: No Food Insecurity (08/22/2022)   Hunger Vital Sign    Worried About Running Out of Food in the Last Year: Never true    Ran Out of Food in the Last Year: Never true  Transportation Needs: No Transportation Needs (08/22/2022)   PRAPARE - Administrator, Civil Service (Medical): No    Lack of Transportation (Non-Medical): No  Physical Activity: Insufficiently Active (08/22/2022)   Exercise Vital Sign    Days of Exercise per Week: 2 days    Minutes of Exercise per Session: 30 min  Stress: No Stress Concern Present (08/22/2022)   [employer] of Occupational Health - Occupational Stress Questionnaire    Feeling of Stress : Only a little  Social Connections: Moderately Integrated (08/22/2022)   Social Connection and Isolation Panel    Frequency of Communication with Friends and Family: More than three times a week    Frequency of Social Gatherings with Friends and Family: More than three times a week    Attends Religious Services: 1 to 4 times per year    Active Member of [employer] or Organizations: No    Attends Engineer, structural: Not on file    Marital Status: Living with partner   Family History  Problem Relation Age of Onset   Diabetes Mother    Hyperlipidemia Father    Diabetes Maternal Grandmother    Diabetes Maternal Grandfather    Heart disease Maternal Grandfather    Cancer Paternal Grandmother    Hypertension Paternal Grandfather    Colon polyps Paternal Grandfather    Colon cancer Neg Hx    Stomach cancer Neg Hx    Pancreatic cancer Neg Hx    Rectal cancer Neg Hx    Esophageal cancer Neg Hx     Review of Systems: Constitutional: negative for fever or malaise Ophthalmic: negative  for photophobia, double vision or loss of vision Cardiovascular: negative for chest pain, dyspnea on exertion, or new LE swelling Respiratory: negative for SOB or persistent cough Gastrointestinal: negative for abdominal pain, change in bowel habits or melena Genitourinary: negative for dysuria or gross hematuria, no abnormal uterine bleeding or disharge Musculoskeletal: negative for new gait disturbance or muscular weakness Integumentary: negative for new or persistent rashes, no breast lumps Neurological:  negative for TIA or stroke symptoms Psychiatric: negative for SI or delusions Allergic/Immunologic: negative for hives  Patient Care Team    Relationship Specialty Notifications Start End  Jodie Lavern CROME, MD PCP - General Family Medicine  04/29/21   Livingston Rigg, MD Consulting Physician Dermatology  07/31/19   Connee Nest, PA-C (Inactive)  Dermatology  09/02/19   Ray Jacques BRAVO, PA-C  Physician Assistant  05/13/20   Teressa Toribio SQUIBB, MD (Inactive) Consulting Physician Gastroenterology  05/24/21   Mat Browning, MD Consulting Physician Obstetrics and Gynecology  05/24/21   Gretta Fairy HERO, MD Consulting Physician Otolaryngology  05/24/21     Objective  Vitals: BP 111/78   Pulse 98   Temp 97.7 F (36.5 C)   Ht 5' 9 (1.753 m)   Wt 188 lb 6.4 oz (85.5 kg)   SpO2 100%   BMI 27.82 kg/m  General:  Well developed, well nourished, no acute distress  Psych:  Alert and orientedx3,normal mood and affect HEENT:  Normocephalic, atraumatic, non-icteric sclera, PERRL, supple neck without adenopathy, mass or thyromegaly Cardiovascular:  Normal S1, S2, RRR without gallop, rub or murmur Respiratory:  Good breath sounds bilaterally, CTAB with normal respiratory effort Gastrointestinal: normal bowel sounds, soft, non-tender, no noted masses. No HSM MSK: no deformities, contusions. Joints are without erythema or swelling.  Skin:  Warm, no rashes or suspicious lesions  noted Neurologic:    Mental status is normal. Gross motor and sensory exams are normal. Normal gait. No tremor    Commons side effects, risks, benefits, and alternatives for medications and treatment plan prescribed today were discussed, and the patient expressed understanding of the given instructions. Patient is instructed to call or message via MyChart if he/she has any questions or concerns regarding our treatment plan. No barriers to understanding were identified. We discussed Red Flag symptoms and signs in detail. Patient expressed understanding regarding what to do in case of urgent or emergency type symptoms.  Medication list was reconciled, printed and provided to the patient in AVS. Patient instructions and summary information was reviewed with the patient as documented in the AVS. This note was prepared with assistance of Dragon voice recognition software. Occasional wrong-word or sound-a-like substitutions may have occurred due to the inherent limitations of voice recognition software .

## 2023-11-22 ENCOUNTER — Other Ambulatory Visit

## 2023-11-22 DIAGNOSIS — Z1159 Encounter for screening for other viral diseases: Secondary | ICD-10-CM

## 2023-11-22 DIAGNOSIS — F331 Major depressive disorder, recurrent, moderate: Secondary | ICD-10-CM | POA: Diagnosis not present

## 2023-11-23 ENCOUNTER — Telehealth: Payer: Self-pay

## 2023-11-23 LAB — HEPATITIS C ANTIBODY: Hepatitis C Ab: NONREACTIVE

## 2023-11-23 NOTE — Telephone Encounter (Signed)
 Copied from CRM [id number]. Topic: General - Other >> Nov 21, 2023  3:28 PM Thersia C wrote: Reason for CRM: Patient called in regarding her labs, stated she will be coming tomorrow to get it done as she can not come today  Noted

## 2023-11-26 ENCOUNTER — Ambulatory Visit: Payer: Self-pay | Admitting: Family Medicine

## 2023-11-26 NOTE — Progress Notes (Signed)
Labs reviewed.  The ASCVD Risk score (Arnett DK, et al., 2019) failed to calculate for the following reasons:   The 2019 ASCVD risk score is only valid for ages 40 to 79 

## 2023-11-30 DIAGNOSIS — F331 Major depressive disorder, recurrent, moderate: Secondary | ICD-10-CM | POA: Diagnosis not present

## 2023-12-13 DIAGNOSIS — F331 Major depressive disorder, recurrent, moderate: Secondary | ICD-10-CM | POA: Diagnosis not present

## 2024-01-17 NOTE — Telephone Encounter (Signed)
 Patient contacted clinic and needed to reschedule 11/12 appointment. Scheduled for next available 2/13 and added to wait list. Patient was to return in 6 weeks to discuss meds. 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 Still wanting to keep 2/13 as well for in person visit- please do not cancel/reschedule.   Call back: [phone]

## 2024-01-18 NOTE — Telephone Encounter (Signed)
 Called patient left detailed message with appointment

## 2024-01-22 NOTE — Telephone Encounter (Signed)
 [facility] - pending PA Case ID #: [id number] [employer] [place]  Key: [id number]

## 2024-01-23 DIAGNOSIS — F331 Major depressive disorder, recurrent, moderate: Secondary | ICD-10-CM | POA: Diagnosis not present

## 2024-01-23 DIAGNOSIS — F4312 Post-traumatic stress disorder, chronic: Secondary | ICD-10-CM | POA: Diagnosis not present

## 2024-01-23 DIAGNOSIS — F411 Generalized anxiety disorder: Secondary | ICD-10-CM | POA: Diagnosis not present

## 2024-01-24 DIAGNOSIS — F331 Major depressive disorder, recurrent, moderate: Secondary | ICD-10-CM | POA: Diagnosis not present

## 2024-01-24 NOTE — Telephone Encounter (Signed)
 PA Status Approved  Medication [facility] 100MG  Capsule  PA Approval Dates   PA Number 01/22/2024 - 01/21/2025  [id number]  Provider Janit [place]  [employer] [place]   Approval letter saved to [employer] tab

## 2024-11-25 ENCOUNTER — Encounter: Admitting: Family Medicine
